# Patient Record
Sex: Male | Born: 1965 | Race: White | Hispanic: No | Marital: Married | Smoking: Current every day smoker
Health system: Southern US, Community
[De-identification: ages and names within clinical notes are randomized; demographics above are authoritative.]

## PROBLEM LIST (undated history)

## (undated) DIAGNOSIS — K219 Gastro-esophageal reflux disease without esophagitis: Secondary | ICD-10-CM

## (undated) DIAGNOSIS — F17200 Nicotine dependence, unspecified, uncomplicated: Secondary | ICD-10-CM

## (undated) DIAGNOSIS — E785 Hyperlipidemia, unspecified: Secondary | ICD-10-CM

## (undated) DIAGNOSIS — I10 Essential (primary) hypertension: Secondary | ICD-10-CM

---

## 1997-08-03 ENCOUNTER — Emergency Department (HOSPITAL_COMMUNITY): Admission: EM | Admit: 1997-08-03 | Discharge: 1997-08-03 | Payer: Self-pay

## 2004-04-06 ENCOUNTER — Ambulatory Visit: Payer: Self-pay | Admitting: Internal Medicine

## 2004-06-21 ENCOUNTER — Ambulatory Visit: Payer: Self-pay | Admitting: Internal Medicine

## 2004-07-12 ENCOUNTER — Ambulatory Visit: Payer: Self-pay | Admitting: Internal Medicine

## 2005-04-07 ENCOUNTER — Ambulatory Visit: Payer: Self-pay | Admitting: Internal Medicine

## 2005-09-20 ENCOUNTER — Ambulatory Visit: Payer: Self-pay | Admitting: Internal Medicine

## 2005-10-10 ENCOUNTER — Ambulatory Visit: Payer: Self-pay | Admitting: Internal Medicine

## 2010-02-02 ENCOUNTER — Emergency Department (HOSPITAL_COMMUNITY)
Admission: EM | Admit: 2010-02-02 | Discharge: 2010-02-02 | Payer: Self-pay | Source: Home / Self Care | Admitting: Emergency Medicine

## 2010-02-07 LAB — URINE CULTURE
Colony Count: NO GROWTH
Culture  Setup Time: 201201112123
Culture: NO GROWTH

## 2010-02-07 LAB — DIFFERENTIAL
Basophils Absolute: 0.1 10*3/uL (ref 0.0–0.1)
Basophils Relative: 0 % (ref 0–1)
Eosinophils Absolute: 0.2 10*3/uL (ref 0.0–0.7)
Eosinophils Relative: 2 % (ref 0–5)
Lymphocytes Relative: 23 % (ref 12–46)
Lymphs Abs: 2.9 10*3/uL (ref 0.7–4.0)
Monocytes Absolute: 0.5 10*3/uL (ref 0.1–1.0)
Monocytes Relative: 4 % (ref 3–12)
Neutro Abs: 9 10*3/uL — ABNORMAL HIGH (ref 1.7–7.7)
Neutrophils Relative %: 71 % (ref 43–77)

## 2010-02-07 LAB — COMPREHENSIVE METABOLIC PANEL
ALT: 21 U/L (ref 0–53)
AST: 20 U/L (ref 0–37)
Albumin: 4.3 g/dL (ref 3.5–5.2)
Alkaline Phosphatase: 86 U/L (ref 39–117)
BUN: 8 mg/dL (ref 6–23)
CO2: 23 mEq/L (ref 19–32)
Calcium: 9.1 mg/dL (ref 8.4–10.5)
Chloride: 103 mEq/L (ref 96–112)
Creatinine, Ser: 0.96 mg/dL (ref 0.4–1.5)
GFR calc Af Amer: >60 mL/min (ref >60)
GFR calc non Af Amer: >60 mL/min (ref >60)
Glucose, Bld: 114 mg/dL — ABNORMAL HIGH (ref 70–99)
Potassium: 4 mEq/L (ref 3.5–5.1)
Sodium: 134 mEq/L — ABNORMAL LOW (ref 135–145)
Total Bilirubin: 0.5 mg/dL (ref 0.3–1.2)
Total Protein: 7.2 g/dL (ref 6.0–8.3)

## 2010-02-07 LAB — URINALYSIS, ROUTINE W REFLEX MICROSCOPIC
Bilirubin Urine: NEGATIVE
Hgb urine dipstick: NEGATIVE
Ketones, ur: NEGATIVE mg/dL
Nitrite: NEGATIVE
Protein, ur: NEGATIVE mg/dL
Specific Gravity, Urine: 1.013 (ref 1.005–1.030)
Urine Glucose, Fasting: NEGATIVE mg/dL
Urobilinogen, UA: 1 mg/dL (ref 0.0–1.0)
pH: 7 (ref 5.0–8.0)

## 2010-02-07 LAB — CBC
HCT: 47.5 % (ref 39.0–52.0)
Hemoglobin: 16.8 g/dL (ref 13.0–17.0)
MCH: 30.1 pg (ref 26.0–34.0)
MCHC: 35.4 g/dL (ref 30.0–36.0)
MCV: 85.1 fL (ref 78.0–100.0)
Platelets: 314 10*3/uL (ref 150–400)
RBC: 5.58 MIL/uL (ref 4.22–5.81)
RDW: 12.8 % (ref 11.5–15.5)
WBC: 12.6 10*3/uL — ABNORMAL HIGH (ref 4.0–10.5)

## 2010-02-07 LAB — LACTIC ACID, PLASMA: Lactic Acid, Venous: 1.6 mmol/L (ref 0.5–2.2)

## 2010-02-07 LAB — LIPASE, BLOOD: Lipase: 51 U/L (ref 11–59)

## 2010-06-10 NOTE — Assessment & Plan Note (Signed)
[facility]                               PULMONARY OFFICE NOTE   NAME:SUTPHINKayan, Gerald George                     MRN:          [id number]  DATE:10/10/2005                            DOB:          03-25-1965    Pulmonary allergy followup.   PROBLEM:  1. Asthma.  2. Allergic rhinitis.  3. Esophageal reflux.  4. Tobacco use.   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  He has continued smoking 1 pack per day.  He comes now to establish with this practice for continuity.  He has been  getting his allergy shots at 1:50, given at [facility] in [place],  with no problems at all.  He is taking Prevacid on a regular basis every 3  to 4 days, and says this controls his reflux symptoms.  He feels he is doing  well.  Just in the past week he has had a mild congestion of the nose and  chest with clear secretion.  He thinks it might have been a cold, but it is  already improving.  He has no morning cough, discolored sputum or active  changes.  His last chest x-ray was some time in early 2005.   MEDICATIONS:  Allergy vaccine, Prevacid.   ALLERGIES:  No medication allergy.   OBJECTIVE:  VITAL SIGNS:  Weight 198 pounds, BP 124/68, pulse regular 90,  room air saturation 97%.  GENERAL:  This is a comfortable-appearing man with a mild cough after deep  breath only. A little watery sniffing.  LUNGS:  Otherwise lung fields sound clear.  NECK:  No adenopathy.  HEART:  Heart sounds are regular without murmur or gallop.  EXTREMITIES:  There is no cyanosis, clubbing or edema.   IMPRESSION:  1. Asthma with bronchitis, probably multifactorial allergic, tobacco and      occupational welding fume exposures.  2. Allergic rhinitis.  3. Esophageal reflux, controlled with intermittent Prevacid.   PLAN:  1. Heavy emphasis  today on smoking cessation.  He agreed to a booklet and      prescription for Chantix.  2. Chest x-ray.  3. Suggested Claritin, but gave as an alternative a prescription for      generic Allegra 180 mg one daily p.r.n. for nasal drainage.  4. Continue allergy vaccine at 1:50.  5. Schedule return here 6 months, earlier p.r.n.                                   Clinton D. Maple Hudson, MD, [facility], FACP   CDY/MedQ  DD:  10/10/2005  DT:  10/12/2005  Job #:  [id number]   cc:   [facility]

## 2014-05-15 ENCOUNTER — Emergency Department
Admit: 2014-05-15 | Disposition: A | Discharge status: Home / Self Care | Payer: Self-pay | Admitting: Emergency Medicine

## 2014-05-16 LAB — COMPREHENSIVE METABOLIC PANEL
Albumin: 4 g/dL
Alkaline Phosphatase: 79 U/L
Anion Gap: 11 (ref 7–16)
BUN: 14 mg/dL
Bilirubin,Total: 0.5 mg/dL
Calcium, Total: 9.3 mg/dL
Chloride: 101 mmol/L
Co2: 25 mmol/L
Creatinine: 0.91 mg/dL
EGFR (African American): >60
EGFR (Non-African Amer.): >60
Glucose: 135 mg/dL — ABNORMAL HIGH
Potassium: 3.8 mmol/L
SGOT(AST): 19 U/L
SGPT (ALT): 28 U/L
Sodium: 137 mmol/L
Total Protein: 7.3 g/dL

## 2014-05-16 LAB — CBC WITH DIFFERENTIAL/PLATELET
Basophil #: 0 10*3/uL (ref 0.0–0.1)
Basophil %: 0.2 %
Eosinophil #: 0 10*3/uL (ref 0.0–0.7)
Eosinophil %: 0.1 %
HCT: 45.1 % (ref 40.0–52.0)
HGB: 15.1 g/dL (ref 13.0–18.0)
Lymphocyte #: 3.5 10*3/uL (ref 1.0–3.6)
Lymphocyte %: 18.6 %
MCH: 28.8 pg (ref 26.0–34.0)
MCHC: 33.5 g/dL (ref 32.0–36.0)
MCV: 86 fL (ref 80–100)
Monocyte #: 1 x10 3/mm (ref 0.2–1.0)
Monocyte %: 5.5 %
Neutrophil #: 14.2 10*3/uL — ABNORMAL HIGH (ref 1.4–6.5)
Neutrophil %: 75.6 %
Platelet: 339 10*3/uL (ref 150–440)
RBC: 5.26 10*6/uL (ref 4.40–5.90)
RDW: 13.3 % (ref 11.5–14.5)
WBC: 18.8 10*3/uL — ABNORMAL HIGH (ref 3.8–10.6)

## 2015-09-14 ENCOUNTER — Emergency Department
Admission: EM | Admit: 2015-09-14 | Discharge: 2015-09-14 | Disposition: A | Discharge status: Home / Self Care | Payer: Managed Care, Other (non HMO) | Attending: Emergency Medicine | Admitting: Emergency Medicine

## 2015-09-14 ENCOUNTER — Emergency Department: Payer: Managed Care, Other (non HMO)

## 2015-09-14 ENCOUNTER — Encounter: Payer: Self-pay | Admitting: Medical Oncology

## 2015-09-14 DIAGNOSIS — R1013 Epigastric pain: Secondary | ICD-10-CM | POA: Diagnosis not present

## 2015-09-14 DIAGNOSIS — R0600 Dyspnea, unspecified: Secondary | ICD-10-CM | POA: Insufficient documentation

## 2015-09-14 HISTORY — DX: Gastro-esophageal reflux disease without esophagitis: K21.9

## 2015-09-14 LAB — CBC WITH DIFFERENTIAL/PLATELET
Basophils Absolute: 0.1 10*3/uL (ref 0–0.1)
Basophils Relative: 1 %
Eosinophils Absolute: 0.3 10*3/uL (ref 0–0.7)
Eosinophils Relative: 2 %
HCT: 45.5 % (ref 40.0–52.0)
Hemoglobin: 16 g/dL (ref 13.0–18.0)
Lymphocytes Relative: 23 %
Lymphs Abs: 3.2 10*3/uL (ref 1.0–3.6)
MCH: 29.8 pg (ref 26.0–34.0)
MCHC: 35.2 g/dL (ref 32.0–36.0)
MCV: 84.7 fL (ref 80.0–100.0)
Monocytes Absolute: 0.8 10*3/uL (ref 0.2–1.0)
Monocytes Relative: 6 %
Neutro Abs: 9.2 10*3/uL — ABNORMAL HIGH (ref 1.4–6.5)
Neutrophils Relative %: 68 %
Platelets: 258 10*3/uL (ref 150–440)
RBC: 5.37 MIL/uL (ref 4.40–5.90)
RDW: 13.6 % (ref 11.5–14.5)
WBC: 13.6 10*3/uL — ABNORMAL HIGH (ref 3.8–10.6)

## 2015-09-14 LAB — COMPREHENSIVE METABOLIC PANEL
ALT: 31 U/L (ref 17–63)
AST: 22 U/L (ref 15–41)
Albumin: 4 g/dL (ref 3.5–5.0)
Alkaline Phosphatase: 66 U/L (ref 38–126)
Anion gap: 5 (ref 5–15)
BUN: 10 mg/dL (ref 6–20)
CO2: 23 mmol/L (ref 22–32)
Calcium: 8.9 mg/dL (ref 8.9–10.3)
Chloride: 109 mmol/L (ref 101–111)
Creatinine, Ser: 0.76 mg/dL (ref 0.61–1.24)
GFR calc Af Amer: >60 mL/min (ref >60)
GFR calc non Af Amer: >60 mL/min (ref >60)
Glucose, Bld: 86 mg/dL (ref 65–99)
Potassium: 4 mmol/L (ref 3.5–5.1)
Sodium: 137 mmol/L (ref 135–145)
Total Bilirubin: 0.5 mg/dL (ref 0.3–1.2)
Total Protein: 7 g/dL (ref 6.5–8.1)

## 2015-09-14 LAB — TROPONIN I: Troponin I: <0.03 ng/mL (ref <0.03)

## 2015-09-14 LAB — LIPASE, BLOOD: Lipase: 58 U/L — ABNORMAL HIGH (ref 11–51)

## 2015-09-14 MED ORDER — ALBUTEROL SULFATE (2.5 MG/3ML) 0.083% IN NEBU
5.0000 mg | INHALATION_SOLUTION | Freq: Once | RESPIRATORY_TRACT | Status: AC
  Administered 2015-09-14: 5 mg via RESPIRATORY_TRACT
  Filled 2015-09-14: qty 6

## 2015-09-14 MED ORDER — FAMOTIDINE 20 MG PO TABS: 20.0000 mg | ORAL_TABLET | Freq: Two times a day (BID) | ORAL | 0 refills | Status: DC

## 2015-09-14 MED ORDER — FAMOTIDINE 20 MG PO TABS
40.0000 mg | ORAL_TABLET | Freq: Once | ORAL | Status: AC
  Administered 2015-09-14: 40 mg via ORAL
  Filled 2015-09-14: qty 2

## 2015-09-14 MED ORDER — IPRATROPIUM-ALBUTEROL 0.5-2.5 (3) MG/3ML IN SOLN
3.0000 mL | Freq: Once | RESPIRATORY_TRACT | Status: AC
  Administered 2015-09-14: 3 mL via RESPIRATORY_TRACT

## 2015-09-14 MED ORDER — IPRATROPIUM-ALBUTEROL 0.5-2.5 (3) MG/3ML IN SOLN
RESPIRATORY_TRACT | Status: AC
  Administered 2015-09-14: 3 mL via RESPIRATORY_TRACT
  Filled 2015-09-14: qty 3

## 2015-09-14 MED ORDER — GI COCKTAIL ~~LOC~~
30.0000 mL | ORAL | Status: AC
  Administered 2015-09-14: 30 mL via ORAL
  Filled 2015-09-14: qty 30

## 2015-09-14 MED ORDER — METHYLPREDNISOLONE SODIUM SUCC 125 MG IJ SOLR
125.0000 mg | Freq: Once | INTRAMUSCULAR | Status: AC
  Administered 2015-09-14: 125 mg via INTRAVENOUS
  Filled 2015-09-14: qty 2

## 2015-09-14 MED ORDER — SUCRALFATE 1 G PO TABS: 1.0000 g | ORAL_TABLET | Freq: Four times a day (QID) | ORAL | 1 refills | Status: DC

## 2015-09-14 NOTE — ED Notes (Signed)
Patient accidentally dislodged IV catheter. Pressure dressing to site. States cannot tell if GI cocktail changed pain.

## 2015-09-14 NOTE — ED Triage Notes (Signed)
Pt reports he began having upper abd pain/epigastric pain last night, pain worsens when he lays down. Pain radiates into his back, when it hits makes him feel sob.

## 2015-09-14 NOTE — ED Provider Notes (Signed)
[facility] Emergency Department Provider Note  ____________________________________________  Time seen: Approximately 8:54 AM  I have reviewed the triage vital signs and the nursing notes.   HISTORY  Chief Complaint Abdominal Pain    HPI Gerald George is a 50 y.o. male who complains of epigastric pain radiating to his back, feels like sore aching feeling. Worse with movement and lying supine. Associated with feeling of shortness of breath due to pain worsening when he breathes. No chest pain. No cough. No exertional symptoms. No fevers chills or sweats. No vomiting or diarrhea. Pain is moderate intensity, started this morning around 3 AM. Not affected by eating.     Past Medical History:  Diagnosis Date  . GERD (gastroesophageal reflux disease)      There are no active problems to display for this patient.    History reviewed. No pertinent surgical history.   Prior to Admission medications   Medication Sig Start Date End Date Taking? Authorizing Provider  famotidine (PEPCID) 20 MG tablet Take 1 tablet (20 mg total) by mouth 2 (two) times daily. 09/14/15   Sharman CheekPhillip Kenni Newton, MD  sucralfate (CARAFATE) 1 g tablet Take 1 tablet (1 g total) by mouth 4 (four) times daily. 09/14/15   Sharman CheekPhillip Jihan Mellette, MD     Allergies Review of patient's allergies indicates no known allergies.   No family history on file.  Social History Social History  Substance Use Topics  . Smoking status: Not on file  . Smokeless tobacco: Not on file  . Alcohol use Not on file  One pack per day smoker for the past 40 years. No alcohol or drug use  Review of Systems  Constitutional:   No fever or chills.  ENT:   No sore throat. No rhinorrhea. Cardiovascular:   No chest pain. Respiratory:   No dyspnea or cough. Gastrointestinal:   Positive epigastric pain without vomiting or diarrhea.  Genitourinary:   Negative for dysuria or difficulty urinating. Musculoskeletal:    Negative for focal pain or swelling Neurological:   Negative for headaches 10-point ROS otherwise negative.  ____________________________________________   PHYSICAL EXAM:  VITAL SIGNS: ED Triage Vitals  Enc Vitals Group     BP 09/14/15 0833 (!) 172/106     Pulse Rate 09/14/15 0833 93     Resp 09/14/15 0833 18     Temp 09/14/15 0833 98.1 F (36.7 C)     Temp Source 09/14/15 0833 Oral     SpO2 09/14/15 0833 97 %     Weight 09/14/15 0834 210 lb (95.3 kg)     Height 09/14/15 0834 5\' 7"  (1.702 m)     Head Circumference --      Peak Flow --      Pain Score 09/14/15 0834 6     Pain Loc --      Pain Edu? --      Excl. in GC? --     Vital signs reviewed, nursing assessments reviewed.   Constitutional:   Alert and oriented. Well appearing and in no distress. Eyes:   No scleral icterus. No conjunctival pallor. PERRL. EOMI.  No nystagmus. ENT   Head:   Normocephalic and atraumatic.   Nose:   No congestion/rhinnorhea. No septal hematoma   Mouth/Throat:   MMM, no pharyngeal erythema. No peritonsillar mass.    Neck:   No stridor. No SubQ emphysema. No meningismus. Hematological/Lymphatic/Immunilogical:   No cervical lymphadenopathy. Cardiovascular:   RRR. Symmetric bilateral radial and DP pulses.  No murmurs.  Respiratory:   Normal respiratory effort without tachypnea nor retractions. Coarse breath sounds diffusely with expiratory wheezing with forceful expiration. No focal findings.. Chest wall nontender Gastrointestinal:   Soft and nontender. Non distended. There is no CVA tenderness.  No rebound, rigidity, or guarding. Genitourinary:   deferred Musculoskeletal:   Nontender with normal range of motion in all extremities. No joint effusions.  No lower extremity tenderness.  No edema. Neurologic:   Normal speech and language.  CN 2-10 normal. Motor grossly intact. No gross focal neurologic deficits are appreciated.  Skin:    Skin is warm, dry and intact. No rash noted.   No petechiae, purpura, or bullae.  ____________________________________________    LABS (pertinent positives/negatives) (all labs ordered are listed, but only abnormal results are displayed) Labs Reviewed  LIPASE, BLOOD - Abnormal; Notable for the following:       Result Value   Lipase 58 (*)    All other components within normal limits  CBC WITH DIFFERENTIAL/PLATELET - Abnormal; Notable for the following:    WBC 13.6 (*)    Neutro Abs 9.2 (*)    All other components within normal limits  COMPREHENSIVE METABOLIC PANEL  TROPONIN I   ____________________________________________   EKG  Interpreted by me Normal sinus rhythm rate of 89, normal axis intervals. Poor R-wave progression in anterior precordial leads. Normal ST segments and T waves.  ____________________________________________    RADIOLOGY  Chest x-ray unremarkable  ____________________________________________   PROCEDURES Procedures  ____________________________________________   INITIAL IMPRESSION / ASSESSMENT AND PLAN / ED COURSE  Pertinent labs & imaging results that were available during my care of the patient were reviewed by me and considered in my medical decision making (see chart for details).  Patient presents with epigastric pain and subjective shortness of breath. Vital signs unremarkable. Labs and chest x-ray unremarkable.Considering the patient's symptoms, medical history, and physical examination today, I have low suspicion for ACS, PE, TAD, pneumothorax, carditis, mediastinitis, pneumonia, CHF, or sepsis.  I have low suspicion for cholecystitis or biliary pathology, pancreatitis, perforation or bowel obstruction, hernia, intra-abdominal abscess, AAA or dissection, volvulus or intussusception, mesenteric ischemia, or appendicitis.  Most likely this is COPD exacerbation related to long-term smoking, versus gastritis and acid reflux.     Clinical Course  Comment By Time  Workup  unremarkable. Sharman CheekPhillip Inmer Nix, MD 08/22 [phone]  Reports symptoms have not changed. Workup is negative. Wheezing is improved but still present on repeat exam. Abdomen is still nontender. will give steroids and further bronchodilators and reassess again. Sharman CheekPhillip Hiroko Tregre, MD 08/22 1005    ----------------------------------------- 1:00 PM on 09/14/2015 -----------------------------------------  Patient ambulatory without distress. Exam continues to improve after bronchodilators and steroids. Well appearing. Has no symptoms at rest, just continues to report ongoing upper abdominal pain with changes in position primarily. Again the abdomen is completely benign and nontender.  Presentation is not consistent with aortic injury or ACS. Treatment with antacids, follow up with primary care. Usual return cautions given should symptoms worsen. ____________________________________________   FINAL CLINICAL IMPRESSION(S) / ED DIAGNOSES  Final diagnoses:  Epigastric pain  Dyspnea       Portions of this note were generated with dragon dictation software. Dictation errors may occur despite best attempts at proofreading.    Sharman CheekPhillip Cailey Trigueros, MD 09/14/15 [phone]

## 2015-10-01 ENCOUNTER — Other Ambulatory Visit: Payer: Self-pay | Admitting: Internal Medicine

## 2015-10-01 DIAGNOSIS — R0781 Pleurodynia: Secondary | ICD-10-CM

## 2015-10-06 ENCOUNTER — Ambulatory Visit
Admission: RE | Admit: 2015-10-06 | Discharge: 2015-10-06 | Disposition: A | Discharge status: Home / Self Care | Payer: Managed Care, Other (non HMO) | Source: Ambulatory Visit | Attending: Internal Medicine | Admitting: Internal Medicine

## 2015-10-06 DIAGNOSIS — K760 Fatty (change of) liver, not elsewhere classified: Secondary | ICD-10-CM | POA: Insufficient documentation

## 2015-10-06 DIAGNOSIS — R0781 Pleurodynia: Secondary | ICD-10-CM

## 2015-10-06 MED ORDER — IOPAMIDOL (ISOVUE-370) INJECTION 76%
100.0000 mL | Freq: Once | INTRAVENOUS | Status: AC | PRN
  Administered 2015-10-06: 100 mL via INTRAVENOUS

## 2017-02-22 ENCOUNTER — Emergency Department
Admission: EM | Admit: 2017-02-22 | Discharge: 2017-02-22 | Disposition: A | Discharge status: Home / Self Care | Payer: BLUE CROSS/BLUE SHIELD | Attending: Emergency Medicine | Admitting: Emergency Medicine

## 2017-02-22 ENCOUNTER — Emergency Department: Payer: BLUE CROSS/BLUE SHIELD

## 2017-02-22 ENCOUNTER — Other Ambulatory Visit: Payer: Self-pay

## 2017-02-22 DIAGNOSIS — J441 Chronic obstructive pulmonary disease with (acute) exacerbation: Secondary | ICD-10-CM | POA: Insufficient documentation

## 2017-02-22 DIAGNOSIS — I10 Essential (primary) hypertension: Secondary | ICD-10-CM | POA: Diagnosis not present

## 2017-02-22 DIAGNOSIS — J069 Acute upper respiratory infection, unspecified: Secondary | ICD-10-CM | POA: Insufficient documentation

## 2017-02-22 DIAGNOSIS — F17210 Nicotine dependence, cigarettes, uncomplicated: Secondary | ICD-10-CM | POA: Insufficient documentation

## 2017-02-22 DIAGNOSIS — R05 Cough: Secondary | ICD-10-CM | POA: Diagnosis present

## 2017-02-22 HISTORY — DX: Essential (primary) hypertension: I10

## 2017-02-22 LAB — CBC
HCT: 47.3 % (ref 40.0–52.0)
HEMOGLOBIN: 15.8 g/dL (ref 13.0–18.0)
MCH: 29 pg (ref 26.0–34.0)
MCHC: 33.5 g/dL (ref 32.0–36.0)
MCV: 86.7 fL (ref 80.0–100.0)
PLATELETS: 207 10*3/uL (ref 150–440)
RBC: 5.45 MIL/uL (ref 4.40–5.90)
RDW: 13.8 % (ref 11.5–14.5)
WBC: 7.6 10*3/uL (ref 3.8–10.6)

## 2017-02-22 LAB — BASIC METABOLIC PANEL
ANION GAP: 9 (ref 5–15)
BUN: 12 mg/dL (ref 6–20)
CHLORIDE: 105 mmol/L (ref 101–111)
CO2: 21 mmol/L — ABNORMAL LOW (ref 22–32)
CREATININE: 0.89 mg/dL (ref 0.61–1.24)
Calcium: 8.4 mg/dL — ABNORMAL LOW (ref 8.9–10.3)
GFR calc non Af Amer: >60 mL/min (ref >60)
Glucose, Bld: 160 mg/dL — ABNORMAL HIGH (ref 65–99)
POTASSIUM: 3.5 mmol/L (ref 3.5–5.1)
SODIUM: 135 mmol/L (ref 135–145)

## 2017-02-22 LAB — TROPONIN I: Troponin I: <0.03 ng/mL (ref <0.03)

## 2017-02-22 LAB — INFLUENZA PANEL BY PCR (TYPE A & B)
INFLBPCR: NEGATIVE
Influenza A By PCR: POSITIVE — AB

## 2017-02-22 MED ORDER — ALBUTEROL SULFATE HFA 108 (90 BASE) MCG/ACT IN AERS
2.0000 | INHALATION_SPRAY | Freq: Four times a day (QID) | RESPIRATORY_TRACT | 0 refills | Status: DC | PRN
Start: 2017-02-22 — End: 2023-08-07

## 2017-02-22 MED ORDER — IPRATROPIUM-ALBUTEROL 0.5-2.5 (3) MG/3ML IN SOLN
3.0000 mL | Freq: Once | RESPIRATORY_TRACT | Status: AC
  Administered 2017-02-22: 3 mL via RESPIRATORY_TRACT
  Filled 2017-02-22: qty 3

## 2017-02-22 MED ORDER — PREDNISONE 20 MG PO TABS
60.0000 mg | ORAL_TABLET | Freq: Once | ORAL | Status: AC
  Administered 2017-02-22: 60 mg via ORAL
  Filled 2017-02-22: qty 3

## 2017-02-22 MED ORDER — AZITHROMYCIN 250 MG PO TABS: 250.0000 mg | ORAL_TABLET | Freq: Every day | ORAL | 0 refills | Status: AC

## 2017-02-22 MED ORDER — PREDNISONE 20 MG PO TABS: 60.0000 mg | ORAL_TABLET | Freq: Every day | ORAL | 0 refills | Status: DC

## 2017-02-22 MED ORDER — BENZONATATE 100 MG PO CAPS: 100.0000 mg | ORAL_CAPSULE | Freq: Four times a day (QID) | ORAL | 0 refills | Status: DC | PRN

## 2017-02-22 MED ORDER — AZITHROMYCIN 500 MG PO TABS
500.0000 mg | ORAL_TABLET | Freq: Once | ORAL | Status: AC
  Administered 2017-02-22: 500 mg via ORAL
  Filled 2017-02-22: qty 1

## 2017-02-22 NOTE — ED Provider Notes (Signed)
[facility] Emergency Department Provider Note  ____________________________________________  Time seen: Approximately 10:15 PM  I have reviewed the triage vital signs and the nursing notes.   HISTORY  Chief Complaint Chest Pain and Shortness of Breath    HPI Gerald George is a 52 y.o. male with HTN and ongoing tobacco abuse presenting with cough, chest tightness and shortness of breath.  The patient reports that 2 weeks ago he was treated for bronchitis.  He completed his prednisone taper over 10 days, but took his Augmentin only once daily despite the fact that his primary care physician who prescribed him for twice daily dosing.  Over the past few days, he has noticed increase nonproductive cough and shortness of breath that is worse with exertion.  He has had some mild nausea but no vomiting.  Yesterday he had some loose stool.  No fevers or chills, palpitations, lightheadedness or syncope.  No lower extremity swelling or calf pain.  The patient does not use albuterol at home.  The patient is requesting influenza testing because he watches his young granddaughter.   Past Medical History:  Diagnosis Date  . GERD (gastroesophageal reflux disease)   . Hypertension     There are no active problems to display for this patient.   History reviewed. No pertinent surgical history.  Current Outpatient Rx  . Order #: [id number] Class: Print  . Order #: [id number] Class: Print  . Order #: [id number] Class: Print  . Order #: [id number] Class: Print  . Order #: [id number] Class: Print  . Order #: [id number] Class: Print    Allergies Patient has no known allergies.  No family history on file.  Social History Social History   Tobacco Use  . Smoking status: Current Every Day Smoker    Packs/day: 2.00    Types: Cigarettes  . Smokeless tobacco: Never Used  Substance Use Topics  . Alcohol use: No    Frequency: Never  . Drug use: No    Review of  Systems Constitutional: No fever/chills.  No lightheadedness or syncope. Eyes: No visual changes. ENT: No sore throat.  Positive congestion.  No ear pain. Cardiovascular: Positive chest pain. Denies palpitations. Respiratory: Positive exertional shortness of breath.  No cough. Gastrointestinal: No abdominal pain.  No nausea, no vomiting.  No diarrhea.  No constipation. Genitourinary: Negative for dysuria. Musculoskeletal: Positive for back pain, worse with coughing.  No lower extremity swelling or calf pain. Skin: Negative for rash. Neurological: Negative for headaches. No focal numbness, tingling or weakness.     ____________________________________________   PHYSICAL EXAM:  VITAL SIGNS: ED Triage Vitals  Enc Vitals Group     BP 02/22/17 2126 (!) 157/120     Pulse Rate 02/22/17 2126 (!) 106     Resp 02/22/17 2126 (!) 22     Temp 02/22/17 2126 99 F (37.2 C)     Temp Source 02/22/17 2126 Oral     SpO2 02/22/17 2126 98 %     Weight 02/22/17 2128 201 lb (91.2 kg)     Height 02/22/17 2128 5\' 7"  (1.702 m)     Head Circumference --      Peak Flow --      Pain Score 02/22/17 2127 6     Pain Loc --      Pain Edu? --      Excl. in GC? --     Constitutional: Alert and oriented.  Chronically ill appearing but in no acute distress. Answers questions appropriately. Eyes: Conjunctivae are normal.  EOMI. No scleral icterus.  No eye discharge. Head: Atraumatic. Nose: Mild congestion without active rhinorrhea.. Mouth/Throat: Mucous membranes are moist.  Neck: No stridor.  Supple.  No JVD.  No meningismus. Cardiovascular: Normal rate, regular rhythm. No murmurs, rubs or gallops.  Respiratory: Mild tachypnea without accessory muscle use or retractions.  The patient has diffuse end expiratory wheezing.  His O2 sats are 96% with ambulation on room air. Gastrointestinal: Overweight.  Soft, nontender and nondistended.  No guarding or rebound.  No peritoneal signs. Musculoskeletal: No LE  edema. No ttp in the calves or palpable cords.  Negative Homan's sign. Neurologic:  A&Ox3.  Speech is clear.  Face and smile are symmetric.  EOMI.  Moves all extremities well. Skin:  Skin is warm, dry and intact. No rash noted. Psychiatric: Mood and affect are normal. Speech and behavior are normal.  Normal judgement  ____________________________________________   LABS (all labs ordered are listed, but only abnormal results are displayed)  Labs Reviewed  BASIC METABOLIC PANEL - Abnormal; Notable for the following components:      Result Value   CO2 21 (*)    Glucose, Bld 160 (*)    Calcium 8.4 (*)    All other components within normal limits  CBC  TROPONIN I  INFLUENZA PANEL BY PCR (TYPE A & B)   ____________________________________________  EKG  ED ECG REPORT I, Rockne Menghini, the attending physician, personally viewed and interpreted this ECG.   Date: 02/22/2017  EKG Time: 2127  Rate: 102  Rhythm: sinus tachycardia  Axis: normal  Intervals:none  ST&T Change: no STEMI  ____________________________________________  RADIOLOGY  Dg Chest 2 View  Result Date: 02/22/2017 CLINICAL DATA:  Chest pain radiating to the back since last night. Two week history of bronchitis treated with antibiotics and prednisone. Shortness of breath and cough. Nausea. EXAM: CHEST  2 VIEW COMPARISON:  09/14/2015 FINDINGS: The heart size and mediastinal contours are within normal limits. Both lungs are clear. The visualized skeletal structures are unremarkable. IMPRESSION: No active cardiopulmonary disease. Electronically Signed   By: Burman Nieves M.D.   On: 02/22/2017 21:57    ____________________________________________   PROCEDURES  Procedure(s) performed: None  Procedures  Critical Care performed: No ____________________________________________   INITIAL IMPRESSION / ASSESSMENT AND PLAN / ED COURSE  Pertinent labs & imaging results that were available during my care of  the patient were reviewed by me and considered in my medical decision making (see chart for details).  52 y.o. male with ongoing tobacco abuse presenting with cough, chest tightness, wheezing, congestion and rhinorrhea, without fever.  The patient's symptoms are most likely due to a viral URI exacerbating his underlying COPD.  He may have a bacterial infection, as he did not complete his antibiotics as prescribed.  His chest x-ray does not show a pneumonia, nor do I hear a focal infiltrate on my auscultatory exam, but we will treat him with azithromycin.  He does have some wheezing, consistent with his COPD, so we will give him a DuoNeb here.  I will initiate him on a 5-day prednisone burst.  We will swab him for influenza.  There is no evidence of pneumothorax, ACS or MI, and PE is very unlikely.  The patient does have some wheezing but has no hypoxia with ambulation.  Anticipate that we will be able to discharge the patient home after his symptomatic treatment, with close PMD follow-up.  I did discuss smoking cessation and decreasing smoking with the patient and his wife  ____________________________________________  FINAL CLINICAL IMPRESSION(S) / ED DIAGNOSES  Final diagnoses:  COPD exacerbation (HCC)  Upper respiratory tract infection, unspecified type         NEW MEDICATIONS STARTED DURING THIS VISIT:  New Prescriptions   ALBUTEROL (PROVENTIL HFA;VENTOLIN HFA) 108 (90 BASE) MCG/ACT INHALER    Inhale 2 puffs into the lungs every 6 (six) hours as needed for wheezing or shortness of breath.   AZITHROMYCIN (ZITHROMAX) 250 MG TABLET    Take 1 tablet (250 mg total) by mouth daily for 4 days.   BENZONATATE (TESSALON PERLES) 100 MG CAPSULE    Take 1 capsule (100 mg total) by mouth every 6 (six) hours as needed for cough.   PREDNISONE (DELTASONE) 20 MG TABLET    Take 3 tablets (60 mg total) by mouth daily.      Rockne Menghini, MD 02/22/17 2224

## 2017-02-22 NOTE — ED Triage Notes (Signed)
Pt c/o chest pain - he states 2 weeks ago he had bronchitis and was tc with atb and prednisone - now his chest and back are hurting - c/o shortness of breath with a non-productive cough - denies vomiting - c/o nausea

## 2017-02-22 NOTE — Discharge Instructions (Signed)
Please drink plenty of fluids to stay well-hydrated.  You may take Tylenol or Motrin if you develop fever.  For your coughing, use your albuterol inhaler, and Tessalon Perles.  Please take the entire course of antibiotics and steroids as prescribed.  Return to the emergency department if you develop severe pain, shortness of breath, lightheadedness or fainting, or any other symptoms concerning to you.

## 2018-02-22 IMAGING — CR DG CHEST 2V
1 series · 2 of 2 positions shown · non-contrast
Comparison: 09/14/2015

CLINICAL DATA: Chest pain radiating to the back since last night.
Two week history of bronchitis treated with antibiotics and
prednisone. Shortness of breath and cough. Nausea.

EXAM:
CHEST  2 VIEW

[Series 1: w chest pa · 0.14mm/px · 2 of 2 slices shown]
[im 1/2]
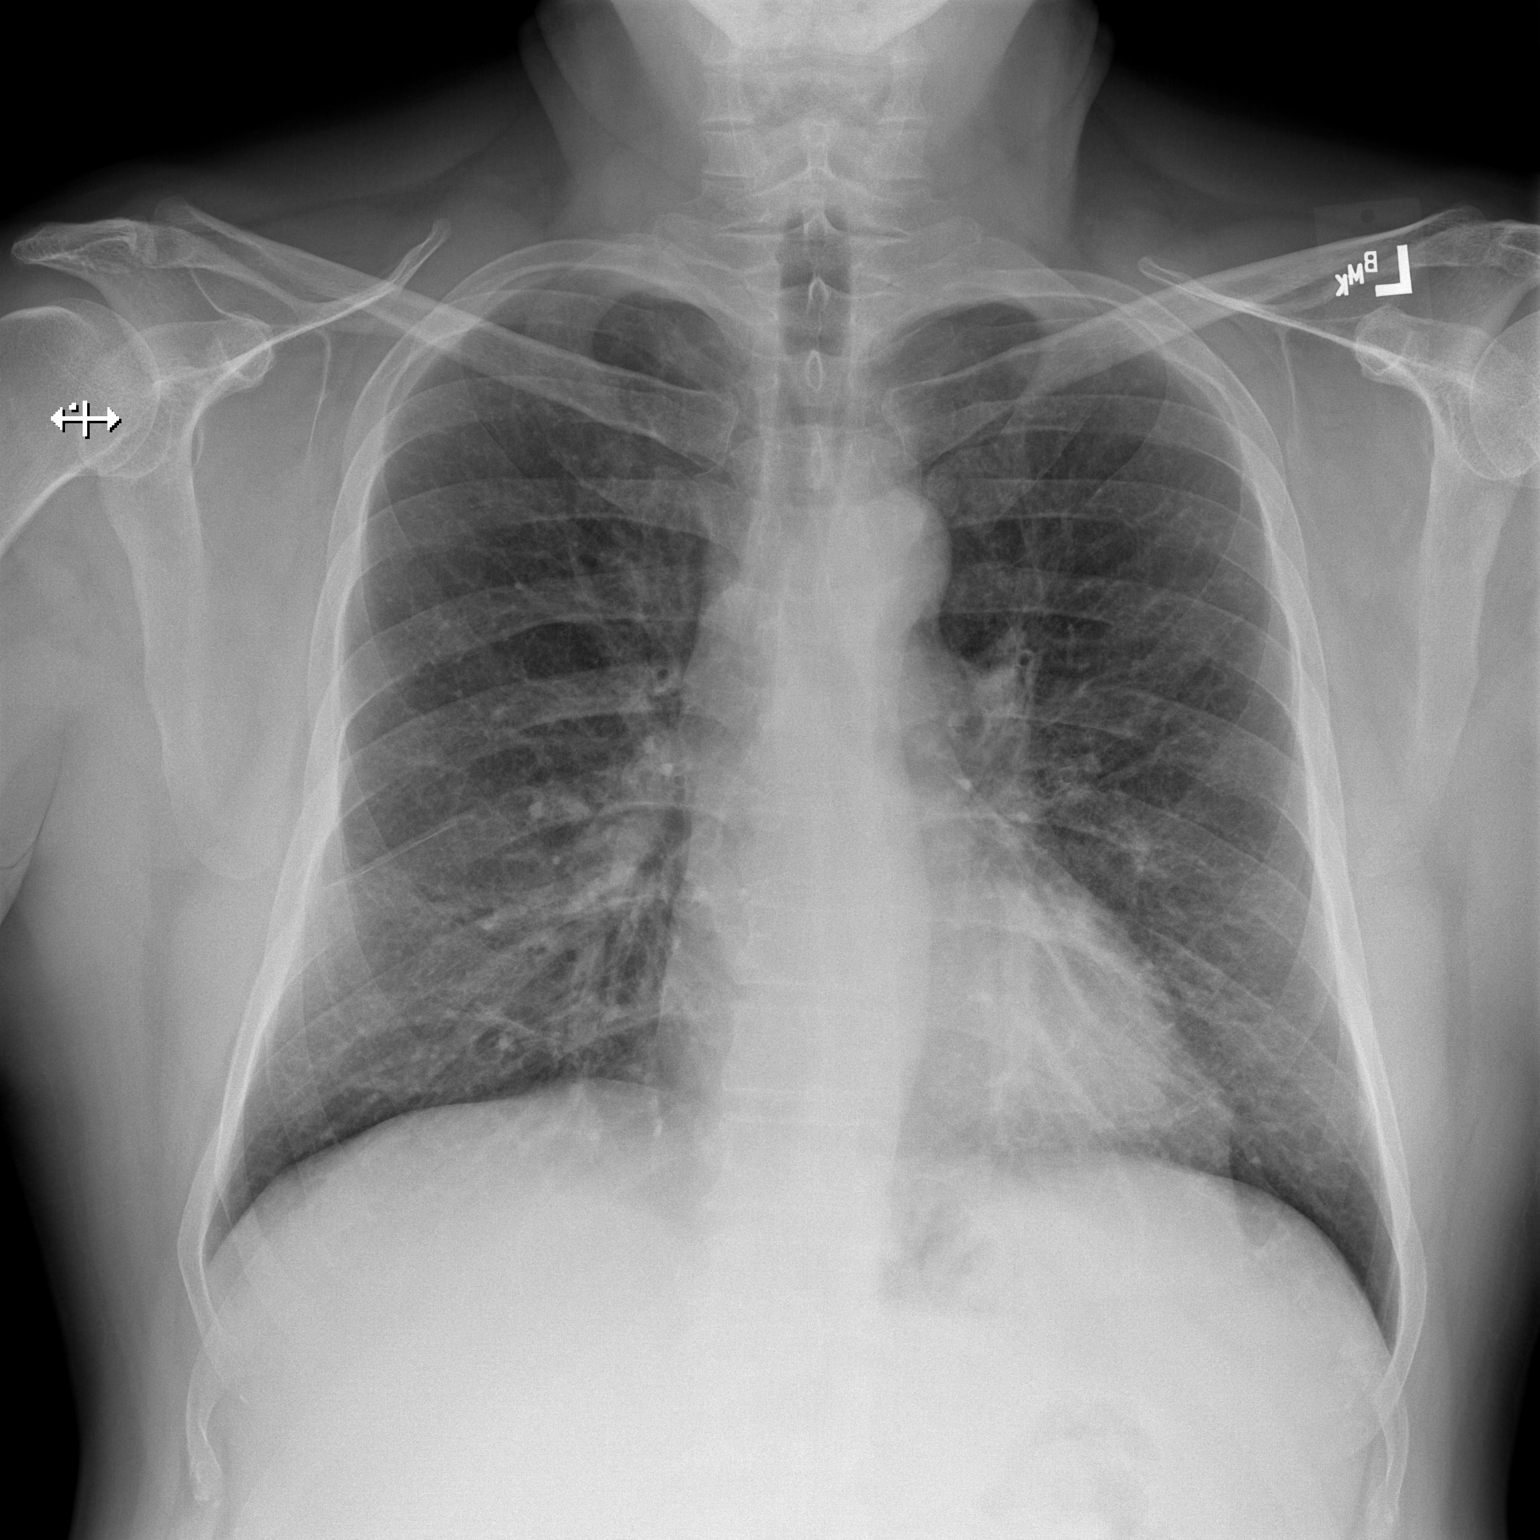
[im 2/2]
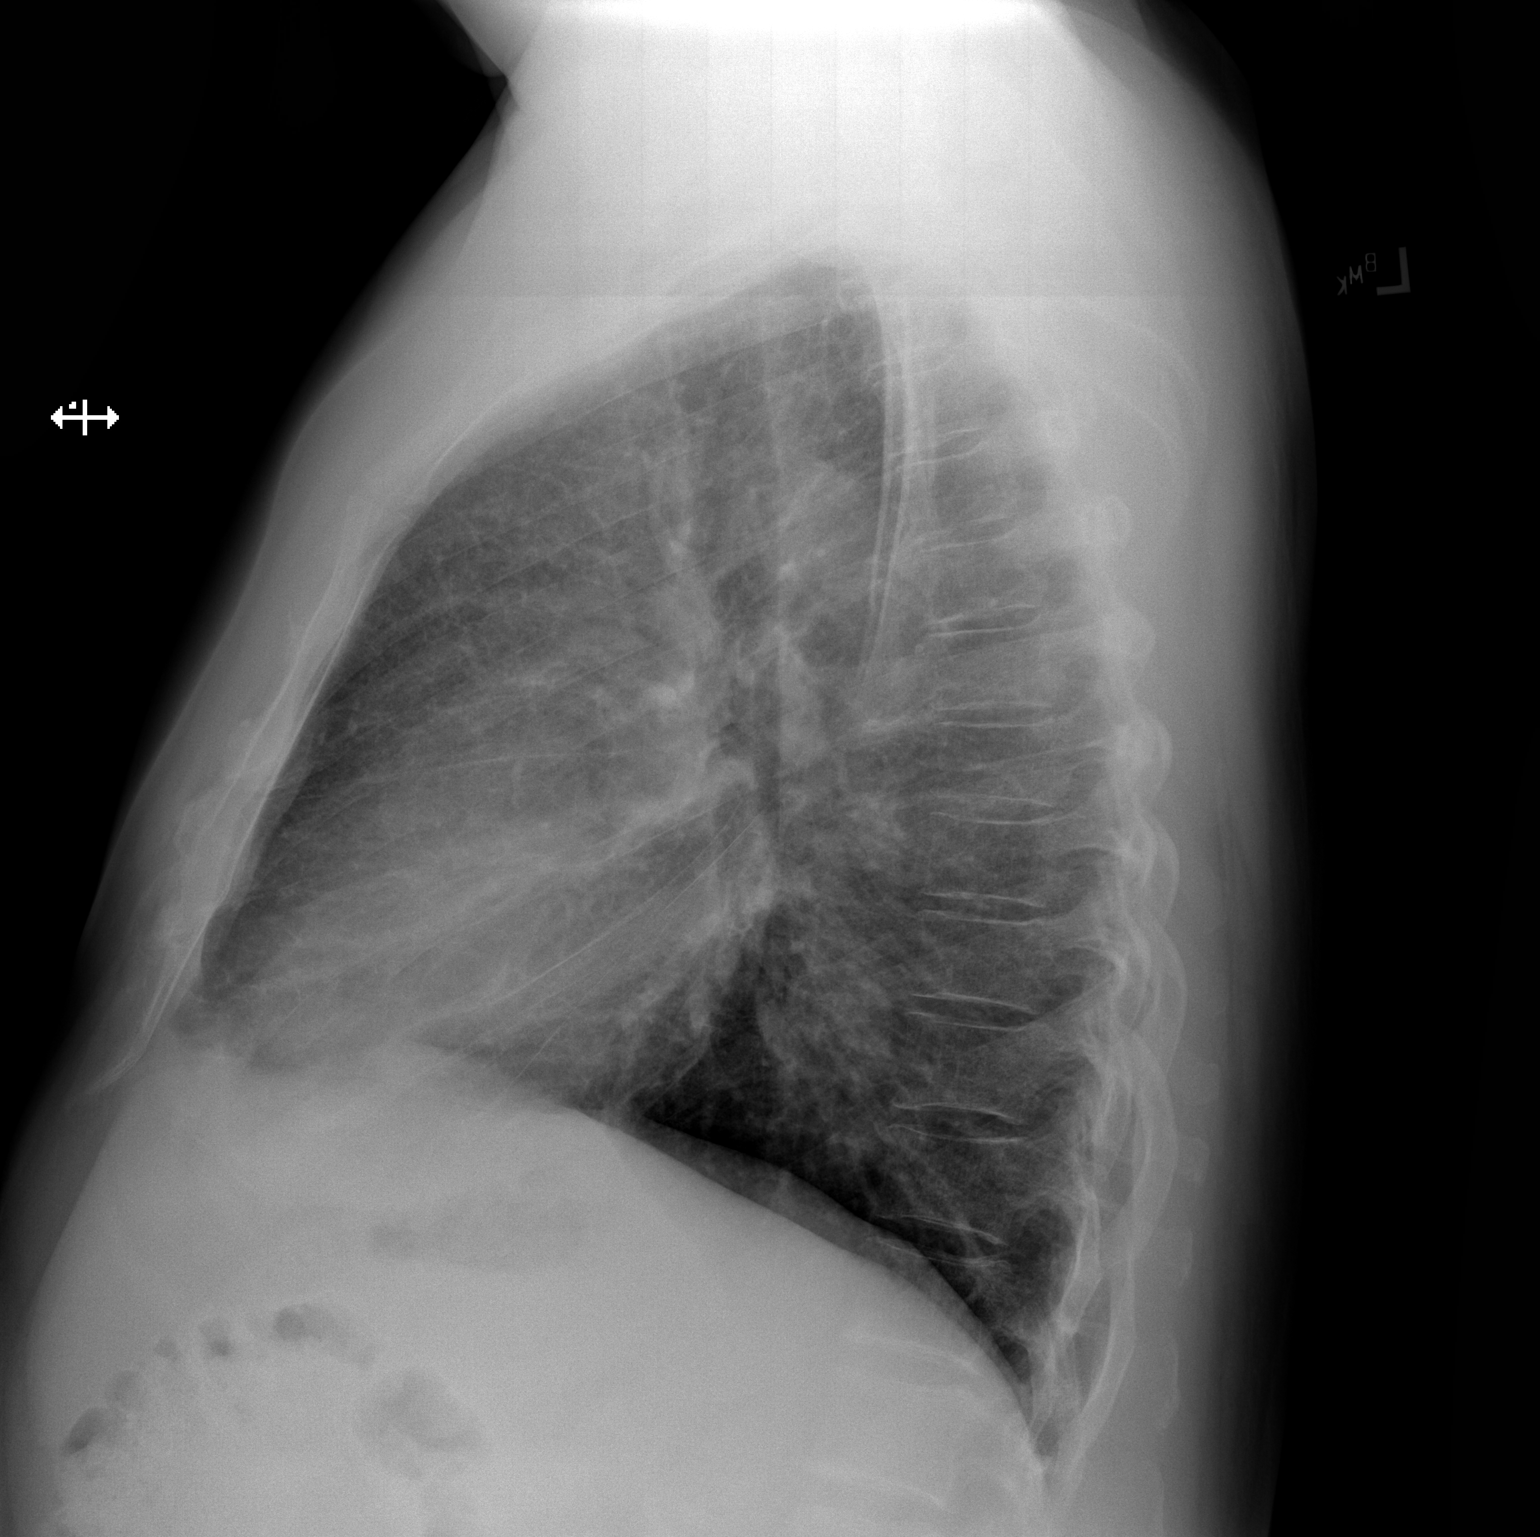

[2 of 2 positions shown; findings below may reference images not displayed]

FINDINGS: The heart size and mediastinal contours are within normal limits.
Both lungs are clear. The visualized skeletal structures are
unremarkable.
IMPRESSION: No active cardiopulmonary disease.

## 2023-01-19 ENCOUNTER — Other Ambulatory Visit: Payer: Self-pay | Admitting: Internal Medicine

## 2023-01-19 DIAGNOSIS — Z122 Encounter for screening for malignant neoplasm of respiratory organs: Secondary | ICD-10-CM

## 2023-01-19 DIAGNOSIS — F17200 Nicotine dependence, unspecified, uncomplicated: Secondary | ICD-10-CM

## 2023-02-14 ENCOUNTER — Other Ambulatory Visit: Payer: Self-pay | Admitting: Internal Medicine

## 2023-02-14 DIAGNOSIS — Z720 Tobacco use: Secondary | ICD-10-CM

## 2023-02-14 DIAGNOSIS — R0609 Other forms of dyspnea: Secondary | ICD-10-CM

## 2023-02-14 DIAGNOSIS — R634 Abnormal weight loss: Secondary | ICD-10-CM

## 2023-02-19 ENCOUNTER — Ambulatory Visit
Admission: RE | Admit: 2023-02-19 | Discharge: 2023-02-19 | Disposition: A | Discharge status: Home / Self Care | Payer: No Typology Code available for payment source | Source: Ambulatory Visit | Attending: Internal Medicine | Admitting: Internal Medicine

## 2023-02-19 DIAGNOSIS — Z720 Tobacco use: Secondary | ICD-10-CM

## 2023-02-19 DIAGNOSIS — R0609 Other forms of dyspnea: Secondary | ICD-10-CM

## 2023-02-19 DIAGNOSIS — R634 Abnormal weight loss: Secondary | ICD-10-CM

## 2023-03-07 ENCOUNTER — Other Ambulatory Visit: Payer: Self-pay

## 2023-03-07 ENCOUNTER — Encounter: Payer: Self-pay | Admitting: Emergency Medicine

## 2023-03-07 ENCOUNTER — Emergency Department: Payer: MEDICAID

## 2023-03-07 ENCOUNTER — Inpatient Hospital Stay
Admission: EM | Admit: 2023-03-07 | Discharge: 2023-03-09 | DRG: 281 | Disposition: A | Discharge status: Home / Self Care | Payer: MEDICAID | Attending: Internal Medicine | Admitting: Internal Medicine

## 2023-03-07 DIAGNOSIS — Z7902 Long term (current) use of antithrombotics/antiplatelets: Secondary | ICD-10-CM

## 2023-03-07 DIAGNOSIS — K219 Gastro-esophageal reflux disease without esophagitis: Secondary | ICD-10-CM | POA: Diagnosis present

## 2023-03-07 DIAGNOSIS — J189 Pneumonia, unspecified organism: Secondary | ICD-10-CM | POA: Diagnosis present

## 2023-03-07 DIAGNOSIS — I10 Essential (primary) hypertension: Secondary | ICD-10-CM | POA: Diagnosis present

## 2023-03-07 DIAGNOSIS — I214 Non-ST elevation (NSTEMI) myocardial infarction: Principal | ICD-10-CM | POA: Diagnosis present

## 2023-03-07 DIAGNOSIS — J439 Emphysema, unspecified: Secondary | ICD-10-CM | POA: Diagnosis present

## 2023-03-07 DIAGNOSIS — Z8249 Family history of ischemic heart disease and other diseases of the circulatory system: Secondary | ICD-10-CM

## 2023-03-07 DIAGNOSIS — I700 Atherosclerosis of aorta: Secondary | ICD-10-CM | POA: Diagnosis present

## 2023-03-07 DIAGNOSIS — Z6830 Body mass index (BMI) 30.0-30.9, adult: Secondary | ICD-10-CM

## 2023-03-07 DIAGNOSIS — J219 Acute bronchiolitis, unspecified: Secondary | ICD-10-CM | POA: Diagnosis present

## 2023-03-07 DIAGNOSIS — J440 Chronic obstructive pulmonary disease with acute lower respiratory infection: Secondary | ICD-10-CM | POA: Diagnosis present

## 2023-03-07 DIAGNOSIS — F17200 Nicotine dependence, unspecified, uncomplicated: Secondary | ICD-10-CM | POA: Diagnosis present

## 2023-03-07 DIAGNOSIS — I2510 Atherosclerotic heart disease of native coronary artery without angina pectoris: Secondary | ICD-10-CM | POA: Diagnosis present

## 2023-03-07 DIAGNOSIS — E785 Hyperlipidemia, unspecified: Secondary | ICD-10-CM | POA: Diagnosis present

## 2023-03-07 DIAGNOSIS — E669 Obesity, unspecified: Secondary | ICD-10-CM | POA: Diagnosis present

## 2023-03-07 DIAGNOSIS — E876 Hypokalemia: Secondary | ICD-10-CM | POA: Insufficient documentation

## 2023-03-07 DIAGNOSIS — Z7982 Long term (current) use of aspirin: Secondary | ICD-10-CM

## 2023-03-07 DIAGNOSIS — Z79899 Other long term (current) drug therapy: Secondary | ICD-10-CM

## 2023-03-07 DIAGNOSIS — J849 Interstitial pulmonary disease, unspecified: Secondary | ICD-10-CM

## 2023-03-07 DIAGNOSIS — Z800 Family history of malignant neoplasm of digestive organs: Secondary | ICD-10-CM

## 2023-03-07 DIAGNOSIS — F17210 Nicotine dependence, cigarettes, uncomplicated: Secondary | ICD-10-CM | POA: Diagnosis present

## 2023-03-07 HISTORY — DX: Hyperlipidemia, unspecified: E78.5

## 2023-03-07 HISTORY — DX: Nicotine dependence, unspecified, uncomplicated: F17.200

## 2023-03-07 LAB — CBC
HCT: 47.9 % (ref 39.0–52.0)
Hemoglobin: 15.9 g/dL (ref 13.0–17.0)
MCH: 28.7 pg (ref 26.0–34.0)
MCHC: 33.2 g/dL (ref 30.0–36.0)
MCV: 86.5 fL (ref 80.0–100.0)
Platelets: 349 10*3/uL (ref 150–400)
RBC: 5.54 MIL/uL (ref 4.22–5.81)
RDW: 12.6 % (ref 11.5–15.5)
WBC: 10.3 10*3/uL (ref 4.0–10.5)
nRBC: 0 % (ref 0.0–0.2)

## 2023-03-07 LAB — BASIC METABOLIC PANEL
Anion gap: 13 (ref 5–15)
BUN: 17 mg/dL (ref 6–20)
CO2: 22 mmol/L (ref 22–32)
Calcium: 9.3 mg/dL (ref 8.9–10.3)
Chloride: 98 mmol/L (ref 98–111)
Creatinine, Ser: 0.93 mg/dL (ref 0.61–1.24)
GFR, Estimated: >60 mL/min (ref >60)
Glucose, Bld: 108 mg/dL — ABNORMAL HIGH (ref 70–99)
Potassium: 3.8 mmol/L (ref 3.5–5.1)
Sodium: 133 mmol/L — ABNORMAL LOW (ref 135–145)

## 2023-03-07 LAB — TROPONIN I (HIGH SENSITIVITY)
Troponin I (High Sensitivity): 193 ng/L (ref <18)
Troponin I (High Sensitivity): 213 ng/L (ref <18)
Troponin I (High Sensitivity): 258 ng/L (ref <18)

## 2023-03-07 LAB — HEMOGLOBIN A1C
Hgb A1c MFr Bld: 5.6 % (ref 4.8–5.6)
Mean Plasma Glucose: 114.02 mg/dL

## 2023-03-07 LAB — HIV ANTIBODY (ROUTINE TESTING W REFLEX): HIV Screen 4th Generation wRfx: NONREACTIVE

## 2023-03-07 LAB — PROTIME-INR
INR: 1 (ref 0.8–1.2)
Prothrombin Time: 13.3 s (ref 11.4–15.2)

## 2023-03-07 LAB — APTT: aPTT: 30 s (ref 24–36)

## 2023-03-07 MED ORDER — ACETAMINOPHEN 325 MG PO TABS
650.0000 mg | ORAL_TABLET | Freq: Four times a day (QID) | ORAL | Status: DC | PRN
  Administered 2023-03-08: 650 mg via ORAL
  Filled 2023-03-07 (×2): qty 2

## 2023-03-07 MED ORDER — ASPIRIN 81 MG PO CHEW
324.0000 mg | CHEWABLE_TABLET | Freq: Once | ORAL | Status: AC
  Administered 2023-03-07: 324 mg via ORAL
  Filled 2023-03-07: qty 4

## 2023-03-07 MED ORDER — ONDANSETRON HCL 4 MG/2ML IJ SOLN: 4.0000 mg | Freq: Three times a day (TID) | INTRAMUSCULAR | Status: DC | PRN

## 2023-03-07 MED ORDER — ASPIRIN 325 MG PO TBEC
325.0000 mg | DELAYED_RELEASE_TABLET | Freq: Every day | ORAL | Status: DC
  Administered 2023-03-08: 325 mg via ORAL
  Filled 2023-03-07: qty 1

## 2023-03-07 MED ORDER — MORPHINE SULFATE (PF) 2 MG/ML IV SOLN: 2.0000 mg | INTRAVENOUS | Status: DC | PRN

## 2023-03-07 MED ORDER — ALBUTEROL SULFATE HFA 108 (90 BASE) MCG/ACT IN AERS: 2.0000 | INHALATION_SPRAY | RESPIRATORY_TRACT | Status: DC | PRN

## 2023-03-07 MED ORDER — HEPARIN (PORCINE) 25000 UT/250ML-% IV SOLN
1650.0000 [IU]/h | INTRAVENOUS | Status: DC
  Administered 2023-03-07: 1100 [IU]/h via INTRAVENOUS
  Filled 2023-03-07 (×3): qty 250

## 2023-03-07 MED ORDER — OXYCODONE-ACETAMINOPHEN 5-325 MG PO TABS: 1.0000 | ORAL_TABLET | ORAL | Status: DC | PRN

## 2023-03-07 MED ORDER — DM-GUAIFENESIN ER 30-600 MG PO TB12: 1.0000 | ORAL_TABLET | Freq: Two times a day (BID) | ORAL | Status: DC | PRN

## 2023-03-07 MED ORDER — ATORVASTATIN CALCIUM 20 MG PO TABS
80.0000 mg | ORAL_TABLET | Freq: Every day | ORAL | Status: DC
  Administered 2023-03-07: 80 mg via ORAL
  Filled 2023-03-07: qty 4

## 2023-03-07 MED ORDER — ALBUTEROL SULFATE (2.5 MG/3ML) 0.083% IN NEBU
2.5000 mg | INHALATION_SOLUTION | RESPIRATORY_TRACT | Status: DC | PRN
  Filled 2023-03-07: qty 3

## 2023-03-07 MED ORDER — HYDRALAZINE HCL 20 MG/ML IJ SOLN: 5.0000 mg | INTRAMUSCULAR | Status: DC | PRN

## 2023-03-07 MED ORDER — HEPARIN BOLUS VIA INFUSION
4000.0000 [IU] | Freq: Once | INTRAVENOUS | Status: AC
  Administered 2023-03-07: 4000 [IU] via INTRAVENOUS
  Filled 2023-03-07: qty 4000

## 2023-03-07 MED ORDER — NITROGLYCERIN 0.4 MG SL SUBL: 0.4000 mg | SUBLINGUAL_TABLET | SUBLINGUAL | Status: DC | PRN

## 2023-03-07 MED ORDER — NICOTINE 21 MG/24HR TD PT24
21.0000 mg | MEDICATED_PATCH | Freq: Every day | TRANSDERMAL | Status: DC
  Administered 2023-03-07 – 2023-03-08 (×2): 21 mg via TRANSDERMAL
  Filled 2023-03-07 (×2): qty 1

## 2023-03-07 NOTE — ED Notes (Signed)
Report given to Ashley, Charity fundraiser. Pt moved to room 34. Care relinquished at this time

## 2023-03-07 NOTE — ED Notes (Signed)
Trop of 193 informed to MD [facility]. Charge RN Marylene Land informed and pt acuity changed.

## 2023-03-07 NOTE — ED Provider Notes (Addendum)
[facility] Provider Note    Event Date/Time   First MD Initiated Contact with Patient 03/07/23 1814     (approximate)   History   Chief Complaint: Chest Pain   HPI  Gerald George is a 58 y.o. male with a history of hypertension, GERD who comes ED complaining of central chest pain that started at about 345 today, associated with shortness of breath and diaphoresis, lasted 45 minutes.  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  Patient reports being in his usual state of health until 5 days ago when he experienced severe central chest pain after digging holes which was very strenuous.  He went inside and lay down, pain subsided after a few hours, but he felt very low energy for the next 48 hours.  Does not see a cardiologist.        Physical Exam   Triage Vital Signs: ED Triage Vitals  Encounter Vitals Group     BP 03/07/23 1708 (!) 182/104     Systolic BP Percentile --      Diastolic BP Percentile --      Pulse Rate 03/07/23 1708 86     Resp 03/07/23 1708 18     Temp 03/07/23 1708 97.9 F (36.6 C)     Temp Source 03/07/23 1708 Oral     SpO2 03/07/23 1708 96 %     Weight 03/07/23 1707 192 lb (87.1 kg)     Height 03/07/23 1707 5\' 7"  (1.702 m)     Head Circumference --      Peak Flow --      Pain Score 03/07/23 1707 7     Pain Loc --      Pain Education --      Exclude from Growth Chart --     Most recent vital signs: Vitals:   03/07/23 1815 03/07/23 1830  BP: (!) 153/100 (!) 153/100  Pulse: 75 75  Resp:  18  Temp:  98 F (36.7 C)  SpO2: 97% 97%    General: Awake, no distress.  CV:  Good peripheral perfusion.  Regular rate rhythm Resp:  Normal effort.  Clear to auscultation bilaterally Abd:  No distention.  Soft nontender Other:  No lower extremity edema   ED Results / Procedures / Treatments   Labs (all labs ordered are listed, but only abnormal results are displayed) Labs Reviewed  BASIC METABOLIC PANEL -  Abnormal; Notable for the following components:      Result Value   Sodium 133 (*)    Glucose, Bld 108 (*)    All other components within normal limits  TROPONIN I (HIGH SENSITIVITY) - Abnormal; Notable for the following components:   Troponin I (High Sensitivity) 193 (*)    All other components within normal limits  TROPONIN I (HIGH SENSITIVITY) - Abnormal; Notable for the following components:   Troponin I (High Sensitivity) 213 (*)    All other components within normal limits  CBC  APTT  PROTIME-INR  HEMOGLOBIN A1C  LIPID PANEL  URINE DRUG SCREEN, QUALITATIVE ([facility] ONLY)  HIV ANTIBODY (ROUTINE TESTING W REFLEX)  BASIC METABOLIC PANEL  CBC  TROPONIN I (HIGH SENSITIVITY)     EKG Interpreted by me Sinus rhythm rate of 80.  Left axis, normal intervals.  No acute ischemic changes.   RADIOLOGY Chest x-ray interpreted by me, unremarkable.  Radiology report reviewed   PROCEDURES:  .Critical Care  Performed by: Sharman Cheek, MD Authorized by: Scotty Court,  Aneta Mins, MD   Critical care provider statement:    Critical care time (minutes):  35   Critical care time was exclusive of:  Separately billable procedures and treating other patients   Critical care was necessary to treat or prevent imminent or life-threatening deterioration of the following conditions:  Cardiac failure   Critical care was time spent personally by me on the following activities:  Development of treatment plan with patient or surrogate, discussions with consultants, evaluation of patient's response to treatment, examination of patient, obtaining history from patient or surrogate, ordering and performing treatments and interventions, ordering and review of laboratory studies, ordering and review of radiographic studies, pulse oximetry, re-evaluation of patient's condition and review of old charts   Care discussed with: admitting provider      MEDICATIONS ORDERED IN ED: Medications  heparin ADULT infusion  100 units/mL (25000 units/219mL) (1,100 Units/hr Intravenous New Bag/Given 03/07/23 1944)  dextromethorphan-guaiFENesin (MUCINEX DM) 30-600 MG per 12 hr tablet 1 tablet (has no administration in time range)  nitroGLYCERIN (NITROSTAT) SL tablet 0.4 mg (has no administration in time range)  morphine (PF) 2 MG/ML injection 2 mg (has no administration in time range)  oxyCODONE-acetaminophen (PERCOCET/ROXICET) 5-325 MG per tablet 1 tablet (has no administration in time range)  ondansetron (ZOFRAN) injection 4 mg (has no administration in time range)  hydrALAZINE (APRESOLINE) injection 5 mg (has no administration in time range)  acetaminophen (TYLENOL) tablet 650 mg (has no administration in time range)  nicotine (NICODERM CQ - dosed in mg/24 hours) patch 21 mg (21 mg Transdermal Patch Applied 03/07/23 1936)  aspirin EC tablet 325 mg (has no administration in time range)  atorvastatin (LIPITOR) tablet 80 mg (80 mg Oral Given 03/07/23 1936)  albuterol (PROVENTIL) (2.5 MG/3ML) 0.083% nebulizer solution 2.5 mg (has no administration in time range)  aspirin chewable tablet 324 mg (324 mg Oral Given 03/07/23 1934)  heparin bolus via infusion 4,000 Units (4,000 Units Intravenous Bolus from Bag 03/07/23 1944)     IMPRESSION / MDM / ASSESSMENT AND PLAN / ED COURSE  I reviewed the triage vital signs and the nursing notes.  DDx: Pneumonia, pneumothorax, non-STEMI, GERD  Patient's presentation is most consistent with acute presentation with potential threat to life or bodily function.  Patient presents with central chest pain that seems to be provoked by exertion.  Worrisome for NSTEMI.  Currently chest pain is resolved.  Vital signs unremarkable, exam reassuring.  Doubt PE or dissection.  Labs reveal troponin of 193.  Record review shows that October 13, 2020 patient had troponin measurement at an outside hospital which was negative at that time.  concern for NSTEMI.  Will start heparin, give aspirin, admit  for further cardiac workup.   ----------------------------------------- 8:06 PM on 03/07/2023 ----------------------------------------- Case discussed with hospitalist      FINAL CLINICAL IMPRESSION(S) / ED DIAGNOSES   Final diagnoses:  NSTEMI (non-ST elevated myocardial infarction) (HCC)     Rx / DC Orders   ED Discharge Orders     None        Note:  This document was prepared using Dragon voice recognition software and may include unintentional dictation errors.   Sharman Cheek, MD 03/07/23 1846    Sharman Cheek, MD 03/07/23 2006

## 2023-03-07 NOTE — Consult Note (Signed)
PHARMACY - ANTICOAGULATION CONSULT NOTE  Pharmacy Consult for heparin infusion Indication: chest pain/ACS  No Known Allergies  Patient Measurements: Height: 5\' 7"  (170.2 cm) Weight: 87.1 kg (192 lb) IBW/kg (Calculated) : 66.1 Heparin Dosing Weight: 84 kg  Vital Signs: Temp: 98 F (36.7 C) (02/12 1830) Temp Source: Oral (02/12 1830) BP: 153/100 (02/12 1830) Pulse Rate: 75 (02/12 1830)  Labs: Recent Labs    03/07/23 1709  HGB 15.9  HCT 47.9  PLT 349  CREATININE 0.93  TROPONINIHS 193*    Estimated Creatinine Clearance: 90.1 mL/min (by C-G formula based on SCr of 0.93 mg/dL).   Medical History: Past Medical History:  Diagnosis Date   GERD (gastroesophageal reflux disease)    Hypertension     Medications:  No home anticoagulants per pharmacist review  Assessment: 58 yo male presented to the ED with primary complaint of chest pain. In ED patient found to have elevated troponin.  Pharmacy consulted to initiate heparin infusion.  Goal of Therapy:  Heparin level 0.3-0.7 units/ml Monitor platelets by anticoagulation protocol: Yes   Plan:  Give 4000 units bolus x 1 Start heparin infusion at 1100 units/hr Check anti-Xa level in 6 hours and daily while on heparin Continue to monitor H&H and platelets  Barrie Folk, PharmD 03/07/2023,6:40 PM

## 2023-03-07 NOTE — ED Triage Notes (Signed)
Patient to ED via POV for CP- states generalized across chest. States burning pain. Intermittent pain since Friday. Denies cardiac hx. States pain is worse with movement. Hx HTN

## 2023-03-07 NOTE — H&P (Signed)
History and Physical    Gerald George ZOX:[id number] DOB: [birth year] DOA: 03/07/2023  Referring MD/NP/PA:   PCP: Danella Penton, MD   Patient coming from:  The patient is coming from home.     Chief Complaint: chest pain  HPI: Gerald George is a 58 y.o. male with medical history significant of smoker, HTN, GERD, obesity with BMI 30.07, who presents with chest pain.  Patient states that he has intermittent chest pain in the past 5 days.  The chest pain is exertional.  Associated with SOB and diaphoresis.    Data reviewed independently and ED Course: pt was found to have troponin 193, GFR> 60, WBC 10.3, temperature normal, blood pressure 153/100, heart rate 86, RR 18, O2 sat 97% on room air.  Chest x-ray negative.  Patient is placed on telemetry bed for observation.  Dr. Juliann Pares of cardiology is consulted.   EKG: I have personally reviewed.  Sinus rhythm, QTc 426, LAD, poor R wave progression.   Review of Systems:   General: no fevers, chills, no body weight gain, has fatigue HEENT: no blurry vision, hearing changes or sore throat Respiratory: no dyspnea, coughing, wheezing CV: has chest pain, no palpitations GI: no nausea, vomiting, abdominal pain, diarrhea, constipation GU: no dysuria, burning on urination, increased urinary frequency, hematuria  Ext: no leg edema Neuro: no unilateral weakness, numbness, or tingling, no vision change or hearing loss Skin: no rash, no skin tear. MSK: No muscle spasm, no deformity, no limitation of range of movement in spin Heme: No easy bruising.  Travel history: No recent long distant travel.   Allergy: No Known Allergies  Past Medical History:  Diagnosis Date   GERD (gastroesophageal reflux disease)    Hypertension    Smoker     History reviewed. No pertinent surgical history.  Social History:  reports that he has been smoking cigarettes. He has never used smokeless tobacco. He reports that he does not drink alcohol and does  not use drugs.  Family History: History reviewed. No pertinent family history.   Prior to Admission medications   Medication Sig Start Date End Date Taking? Authorizing Provider  albuterol (PROVENTIL HFA;VENTOLIN HFA) 108 (90 Base) MCG/ACT inhaler Inhale 2 puffs into the lungs every 6 (six) hours as needed for wheezing or shortness of breath. 02/22/17   Rockne Menghini, MD  benzonatate (TESSALON PERLES) 100 MG capsule Take 1 capsule (100 mg total) by mouth every 6 (six) hours as needed for cough. 02/22/17   Rockne Menghini, MD  famotidine (PEPCID) 20 MG tablet Take 1 tablet (20 mg total) by mouth 2 (two) times daily. 09/14/15   Sharman Cheek, MD  predniSONE (DELTASONE) 20 MG tablet Take 3 tablets (60 mg total) by mouth daily. 02/22/17   Rockne Menghini, MD  sucralfate (CARAFATE) 1 g tablet Take 1 tablet (1 g total) by mouth 4 (four) times daily. 09/14/15   Sharman Cheek, MD    Physical Exam: Vitals:   03/07/23 1708 03/07/23 1815 03/07/23 1830 03/07/23 2000  BP: (!) 182/104 (!) 153/100 (!) 153/100 (!) 146/99  Pulse: 86 75 75 72  Resp: 18  18 (!) 26  Temp: 97.9 F (36.6 C)  98 F (36.7 C)   TempSrc: Oral  Oral   SpO2: 96% 97% 97% 97%  Weight:      Height:       General: Not in acute distress HEENT:       Eyes: PERRL, EOMI, no jaundice       ENT:  No discharge from the ears and nose, no pharynx injection, no tonsillar enlargement.        Neck: No JVD, no bruit, no mass felt. Heme: No neck lymph node enlargement. Cardiac: S1/S2, RRR, No murmurs, No gallops or rubs. Respiratory: No rales, wheezing, rhonchi or rubs. GI: Soft, nondistended, nontender, no rebound pain, no organomegaly, BS present. GU: No hematuria Ext: No pitting leg edema bilaterally. 1+DP/PT pulse bilaterally. Musculoskeletal: No joint deformities, No joint redness or warmth, no limitation of ROM in spin. Skin: No rashes.  Neuro: Alert, oriented X3, cranial nerves II-XII grossly intact, moves all  extremities normally. Psych: Patient is not psychotic, no suicidal or hemocidal ideation.  Labs on Admission: I have personally reviewed following labs and imaging studies  CBC: Recent Labs  Lab 03/07/23 1709  WBC 10.3  HGB 15.9  HCT 47.9  MCV 86.5  PLT 349   Basic Metabolic Panel: Recent Labs  Lab 03/07/23 1709  NA 133*  K 3.8  CL 98  CO2 22  GLUCOSE 108*  BUN 17  CREATININE 0.93  CALCIUM 9.3   GFR: Estimated Creatinine Clearance: 90.1 mL/min (by C-G formula based on SCr of 0.93 mg/dL). Liver Function Tests: No results for input(s): "AST", "ALT", "ALKPHOS", "BILITOT", "PROT", "ALBUMIN" in the last 168 hours. No results for input(s): "LIPASE", "AMYLASE" in the last 168 hours. No results for input(s): "AMMONIA" in the last 168 hours. Coagulation Profile: Recent Labs  Lab 03/07/23 1844  INR 1.0   Cardiac Enzymes: No results for input(s): "CKTOTAL", "CKMB", "CKMBINDEX", "TROPONINI" in the last 168 hours. BNP (last 3 results) No results for input(s): "PROBNP" in the last 8760 hours. HbA1C: No results for input(s): "HGBA1C" in the last 72 hours. CBG: No results for input(s): "GLUCAP" in the last 168 hours. Lipid Profile: No results for input(s): "CHOL", "HDL", "LDLCALC", "TRIG", "CHOLHDL", "LDLDIRECT" in the last 72 hours. Thyroid Function Tests: No results for input(s): "TSH", "T4TOTAL", "FREET4", "T3FREE", "THYROIDAB" in the last 72 hours. Anemia Panel: No results for input(s): "VITAMINB12", "FOLATE", "FERRITIN", "TIBC", "IRON", "RETICCTPCT" in the last 72 hours. Urine analysis:    Component Value Date/Time   COLORURINE YELLOW 02/02/2010 2026   APPEARANCEUR CLEAR 02/02/2010 2026   LABSPEC 1.013 02/02/2010 2026   PHURINE 7.0 02/02/2010 2026   HGBUR NEGATIVE 02/02/2010 2026   BILIRUBINUR NEGATIVE 02/02/2010 2026   KETONESUR NEGATIVE 02/02/2010 2026   PROTEINUR NEGATIVE 02/02/2010 2026   UROBILINOGEN 1.0 02/02/2010 2026   NITRITE NEGATIVE 02/02/2010 2026    LEUKOCYTESUR  02/02/2010 2026    NEGATIVE MICROSCOPIC NOT DONE ON URINES WITH NEGATIVE PROTEIN, BLOOD, LEUKOCYTES, NITRITE, OR GLUCOSE <1000 mg/dL.   Sepsis Labs: @LABRCNTIP (procalcitonin:4,lacticidven:4) )No results found for this or any previous visit (from the past 240 hours).   Radiological Exams on Admission:   Assessment/Plan Principal Problem:   NSTEMI (non-ST elevated myocardial infarction) (HCC) Active Problems:   GERD (gastroesophageal reflux disease)   Smoker   Obesity (BMI 30-39.9)   Assessment and Plan:  NSTEMI (non-ST elevated myocardial infarction) (HCC):  60M, hx of smoker, HTN, GERD, obesity with BMI 30.07, admitted due to intermittent chest pain in the past 5 days, troponin 193, started IV heparin.    Active Problems:   GERD (gastroesophageal reflux disease)   Smoker   Obesity (BMI 30-39.9)    DVT ppx: on IV Heparin    Code Status: Full code   ***  Family Communication:     not done, no family member is at bed side.  Yes, patient's    at bed side.       by phone   ***  Disposition Plan:  Anticipate discharge back to previous environment  Consults called:   Dr. Juliann Pares of cardiology is consulted.  Admission status and Level of care: Telemetry Cardiac:    for obs as inpt        Dispo: The patient is from: {From:23814}              Anticipated d/c is to: {To:23815}              Anticipated d/c date is: {Days:23816}              Patient currently {Medically stable:23817}    Severity of Illness:  {Observation/Inpatient:21159}       Date of Service 03/07/2023    Lorretta Harp [facility]   If 7PM-7AM, please contact night-coverage [email or web address] 03/07/2023, 8:36 PM

## 2023-03-08 ENCOUNTER — Encounter: Payer: Self-pay | Admitting: Internal Medicine

## 2023-03-08 DIAGNOSIS — K219 Gastro-esophageal reflux disease without esophagitis: Secondary | ICD-10-CM

## 2023-03-08 DIAGNOSIS — E785 Hyperlipidemia, unspecified: Secondary | ICD-10-CM | POA: Diagnosis present

## 2023-03-08 DIAGNOSIS — J189 Pneumonia, unspecified organism: Secondary | ICD-10-CM | POA: Diagnosis present

## 2023-03-08 DIAGNOSIS — E876 Hypokalemia: Secondary | ICD-10-CM | POA: Insufficient documentation

## 2023-03-08 DIAGNOSIS — J849 Interstitial pulmonary disease, unspecified: Secondary | ICD-10-CM

## 2023-03-08 LAB — URINE DRUG SCREEN, QUALITATIVE (ARMC ONLY)
Amphetamines, Ur Screen: NOT DETECTED
Barbiturates, Ur Screen: NOT DETECTED
Benzodiazepine, Ur Scrn: NOT DETECTED
Cannabinoid 50 Ng, Ur ~~LOC~~: NOT DETECTED
Cocaine Metabolite,Ur ~~LOC~~: NOT DETECTED
MDMA (Ecstasy)Ur Screen: NOT DETECTED
Methadone Scn, Ur: NOT DETECTED
Opiate, Ur Screen: NOT DETECTED
Phencyclidine (PCP) Ur S: NOT DETECTED
Tricyclic, Ur Screen: NOT DETECTED

## 2023-03-08 LAB — TROPONIN I (HIGH SENSITIVITY)
Troponin I (High Sensitivity): 311 ng/L (ref <18)
Troponin I (High Sensitivity): 264 ng/L (ref <18)
Troponin I (High Sensitivity): 141 ng/L (ref <18)

## 2023-03-08 LAB — EXPECTORATED SPUTUM ASSESSMENT W GRAM STAIN, RFLX TO RESP C

## 2023-03-08 LAB — BASIC METABOLIC PANEL
Anion gap: 10 (ref 5–15)
BUN: 17 mg/dL (ref 6–20)
CO2: 23 mmol/L (ref 22–32)
Calcium: 8.6 mg/dL — ABNORMAL LOW (ref 8.9–10.3)
Chloride: 102 mmol/L (ref 98–111)
Creatinine, Ser: 0.83 mg/dL (ref 0.61–1.24)
GFR, Estimated: >60 mL/min (ref >60)
Glucose, Bld: 98 mg/dL (ref 70–99)
Potassium: 3.4 mmol/L — ABNORMAL LOW (ref 3.5–5.1)
Sodium: 135 mmol/L (ref 135–145)

## 2023-03-08 LAB — CBC
HCT: 41.7 % (ref 39.0–52.0)
Hemoglobin: 14.3 g/dL (ref 13.0–17.0)
MCH: 28.6 pg (ref 26.0–34.0)
MCHC: 34.3 g/dL (ref 30.0–36.0)
MCV: 83.4 fL (ref 80.0–100.0)
Platelets: 322 10*3/uL (ref 150–400)
RBC: 5 MIL/uL (ref 4.22–5.81)
RDW: 12.7 % (ref 11.5–15.5)
WBC: 11.1 10*3/uL — ABNORMAL HIGH (ref 4.0–10.5)
nRBC: 0 % (ref 0.0–0.2)

## 2023-03-08 LAB — LIPID PANEL
Cholesterol: 199 mg/dL (ref 0–200)
HDL: 35 mg/dL — ABNORMAL LOW (ref >40)
LDL Cholesterol: 133 mg/dL — ABNORMAL HIGH (ref 0–99)
Total CHOL/HDL Ratio: 5.7 {ratio}
Triglycerides: 157 mg/dL — ABNORMAL HIGH (ref <150)
VLDL: 31 mg/dL (ref 0–40)

## 2023-03-08 LAB — HEPARIN LEVEL (UNFRACTIONATED)
Heparin Unfractionated: 0.12 [IU]/mL — ABNORMAL LOW (ref 0.30–0.70)
Heparin Unfractionated: 0.18 [IU]/mL — ABNORMAL LOW (ref 0.30–0.70)
Heparin Unfractionated: 0.36 [IU]/mL (ref 0.30–0.70)

## 2023-03-08 MED ORDER — METOPROLOL SUCCINATE ER 25 MG PO TB24
25.0000 mg | ORAL_TABLET | Freq: Every day | ORAL | Status: DC
  Administered 2023-03-08: 25 mg via ORAL
  Filled 2023-03-08: qty 1

## 2023-03-08 MED ORDER — ROSUVASTATIN CALCIUM 20 MG PO TABS
20.0000 mg | ORAL_TABLET | Freq: Every day | ORAL | Status: DC
  Administered 2023-03-08: 20 mg via ORAL
  Filled 2023-03-08: qty 1

## 2023-03-08 MED ORDER — IPRATROPIUM-ALBUTEROL 0.5-2.5 (3) MG/3ML IN SOLN
3.0000 mL | RESPIRATORY_TRACT | Status: DC
  Administered 2023-03-08 – 2023-03-09 (×4): 3 mL via RESPIRATORY_TRACT
  Filled 2023-03-08 (×6): qty 3

## 2023-03-08 MED ORDER — PANTOPRAZOLE SODIUM 40 MG PO TBEC
40.0000 mg | DELAYED_RELEASE_TABLET | Freq: Every day | ORAL | Status: DC
  Administered 2023-03-08: 40 mg via ORAL
  Filled 2023-03-08: qty 1

## 2023-03-08 MED ORDER — LISINOPRIL-HYDROCHLOROTHIAZIDE 20-12.5 MG PO TABS: 1.0000 | ORAL_TABLET | Freq: Every day | ORAL | Status: DC

## 2023-03-08 MED ORDER — METHYLPREDNISOLONE SODIUM SUCC 40 MG IJ SOLR
40.0000 mg | Freq: Two times a day (BID) | INTRAMUSCULAR | Status: DC
  Administered 2023-03-08 (×2): 40 mg via INTRAVENOUS
  Filled 2023-03-08 (×2): qty 1

## 2023-03-08 MED ORDER — ROSUVASTATIN CALCIUM 20 MG PO TABS
40.0000 mg | ORAL_TABLET | Freq: Every day | ORAL | Status: DC
Start: 2023-03-09 — End: 2023-03-09
  Filled 2023-03-08: qty 2

## 2023-03-08 MED ORDER — HEPARIN BOLUS VIA INFUSION
2500.0000 [IU] | Freq: Once | INTRAVENOUS | Status: AC
  Administered 2023-03-08: 2500 [IU] via INTRAVENOUS
  Filled 2023-03-08: qty 2500

## 2023-03-08 MED ORDER — IPRATROPIUM-ALBUTEROL 0.5-2.5 (3) MG/3ML IN SOLN
RESPIRATORY_TRACT | Status: AC
  Administered 2023-03-08: 3 mL via RESPIRATORY_TRACT
  Filled 2023-03-08: qty 3

## 2023-03-08 MED ORDER — LEVOFLOXACIN 500 MG PO TABS
500.0000 mg | ORAL_TABLET | Freq: Every day | ORAL | Status: AC
  Administered 2023-03-08: 500 mg via ORAL
  Filled 2023-03-08: qty 1

## 2023-03-08 MED ORDER — CYANOCOBALAMIN 500 MCG PO TABS
1000.0000 ug | ORAL_TABLET | Freq: Every day | ORAL | Status: DC
  Administered 2023-03-08: 1000 ug via ORAL
  Filled 2023-03-08: qty 2

## 2023-03-08 MED ORDER — ASPIRIN 81 MG PO TBEC: 81.0000 mg | DELAYED_RELEASE_TABLET | Freq: Every day | ORAL | Status: DC

## 2023-03-08 MED ORDER — POTASSIUM CHLORIDE CRYS ER 20 MEQ PO TBCR
40.0000 meq | EXTENDED_RELEASE_TABLET | Freq: Once | ORAL | Status: AC
  Administered 2023-03-08: 40 meq via ORAL
  Filled 2023-03-08: qty 2

## 2023-03-08 MED ORDER — HYDROCHLOROTHIAZIDE 12.5 MG PO TABS
12.5000 mg | ORAL_TABLET | Freq: Every day | ORAL | Status: DC
  Administered 2023-03-08: 12.5 mg via ORAL
  Filled 2023-03-08: qty 1

## 2023-03-08 MED ORDER — LISINOPRIL 10 MG PO TABS
20.0000 mg | ORAL_TABLET | Freq: Every day | ORAL | Status: DC
  Administered 2023-03-08: 20 mg via ORAL
  Filled 2023-03-08: qty 2

## 2023-03-08 NOTE — Assessment & Plan Note (Signed)
Nicotine patch

## 2023-03-08 NOTE — Consult Note (Signed)
PHARMACY - ANTICOAGULATION CONSULT NOTE  Pharmacy Consult for heparin infusion Indication: chest pain/ACS  No Known Allergies  Patient Measurements: Height: 5\' 7"  (170.2 cm) Weight: 87.1 kg (192 lb) IBW/kg (Calculated) : 66.1 Heparin Dosing Weight: 84 kg  Vital Signs: Temp: 97.5 F (36.4 C) (02/13 0907) Temp Source: Oral (02/13 0907) BP: 121/87 (02/13 0907) Pulse Rate: 83 (02/13 0800)  Labs: Recent Labs    03/07/23 1709 03/07/23 1836 03/07/23 1844 03/07/23 1850 03/08/23 0104 03/08/23 0327 03/08/23 0539 03/08/23 0853  HGB 15.9  --   --   --  14.3  --   --   --   HCT 47.9  --   --   --  41.7  --   --   --   PLT 349  --   --   --  322  --   --   --   APTT  --  30  --   --   --   --   --   --   LABPROT  --   --  13.3  --   --   --   --   --   INR  --   --  1.0  --   --   --   --   --   HEPARINUNFRC  --   --   --   --  0.12*  --   --  0.18*  CREATININE 0.93  --   --   --  0.83  --   --   --   TROPONINIHS 193*  --   --    < > 311* 264* 141*  --    < > = values in this interval not displayed.    Estimated Creatinine Clearance: 101 mL/min (by C-G formula based on SCr of 0.83 mg/dL).   Medical History: Past Medical History:  Diagnosis Date   GERD (gastroesophageal reflux disease)    HLD (hyperlipidemia)    Hypertension    Smoker    Medications:  No home anticoagulants per pharmacist review  Assessment: 58 yo male presented to the ED with primary complaint of chest pain. In ED patient found to have elevated troponin.  Pharmacy consulted to initiate heparin infusion.  Goal of Therapy:  Heparin level 0.3-0.7 units/ml Monitor platelets by anticoagulation protocol: Yes  02/13@0104 : HL 0.12, subtherapeutic@1100  units/hr 02/13@0853 : HL 0.18, subtherapeutic on 1350 u/hr   Plan:  Give 2500 units bolus x 1 Increase heparin infusion to 1600 units/hr Check anti-Xa level in 6 hours after rate change and daily while on heparin Continue to monitor H&H and  platelets  Thank you for involving pharmacy in this patient's care.   Rockwell Alexandria, PharmD Clinical Pharmacist 03/08/2023 9:46 AM

## 2023-03-08 NOTE — Consult Note (Signed)
[facility] CARDIOLOGY CONSULT NOTE       Patient ID: Gerald George MRN: [id number] DOB/AGE: [birth year] 60 y.o.  Admit date: 03/07/2023 Referring Physician Dr. Lorretta Harp Primary Physician Danella Penton, MD  Primary Cardiologist None Reason for Consultation NSTEMI  HPI: Salome Cozby is a 58 y.o. male  with a past medical history of hypertension, hyperlipidemia, GERD, tobacco use who presented to the ED on 03/07/2023 for chest pain.  Troponins found to be elevated.  Cardiology was consulted for further evaluation.   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  This was associated with with a burning sensation in his chest.  States that he went home from work at that time and was very fatigued.  Reports that he slept until the afternoon on Saturday.  Throughout the course of the day Saturday he still had chest pain but this was not as severe.  States that on Sunday and Monday he felt better overall with less chest pain but he did not return to work on Monday.  Yesterday he returned to work and was doing more hard labor and had recurrence of chest pain with radiation into both of his arms.  He decided to come to the ED for further evaluation at that time.  Workup in the ED notable for creatinine 0.93, potassium 3.8, hemoglobin 15.9, WBC 10.3.  A1c normal at 5.6.  Troponins trended 193 > 213 > 258 > 311 > 264 > 141.  He was started on IV heparin in the ED.  At the time of my evaluation this morning patient is resting comfortably in ED stretcher.  Reports that overall he feels better now that he has been resting.  We discussed his chest discomfort symptoms in further detail.  He also endorses associated dyspnea on exertion and dizziness with episodes of chest pain.  Denies any palpitations or syncopal episodes.  He states that he has never had any similar symptoms in the past.  Patient reports that he is a smoker for many years but  has been working on quitting.  He had a CT of his chest earlier this month that revealed three-vessel coronary calcification.  Review of systems complete and found to be negative unless listed above    Past Medical History:  Diagnosis Date   GERD (gastroesophageal reflux disease)    HLD (hyperlipidemia)    Hypertension    Smoker     History reviewed. No pertinent surgical history.  (Not in a hospital admission)  Social History   Socioeconomic History   Marital status: Married    Spouse name: Not on file   Number of children: Not on file   Years of education: Not on file   Highest education level: Not on file  Occupational History   Not on file  Tobacco Use   Smoking status: Every Day    Current packs/day: 2.00    Types: Cigarettes   Smokeless tobacco: Never  Substance and Sexual Activity   Alcohol use: No   Drug use: No   Sexual activity: Not on file  Other Topics Concern   Not on file  Social History Narrative   Not on file   Social Drivers of Health   Financial Resource Strain: Medium Risk (01/18/2023)   Received from [facility]   Overall Financial Resource Strain (CARDIA)    Difficulty of Paying Living Expenses: Somewhat hard  Food Insecurity: No  Food Insecurity (01/18/2023)   Received from [facility]   Hunger Vital Sign    Worried About Running Out of Food in the Last Year: Never true    Ran Out of Food in the Last Year: Never true  Transportation Needs: No Transportation Needs (01/18/2023)   Received from [facility] - Transportation    In the past 12 months, has lack of transportation kept you from medical appointments or from getting medications?: No    Lack of Transportation (Non-Medical): No  Physical Activity: Not on file  Stress: Not on file  Social Connections: Not on file  Intimate Partner Violence: Not on file    Family History  Problem Relation Age of Onset   Pancreatic  cancer Father    Heart attack Brother      Vitals:   03/08/23 0907 03/08/23 1012 03/08/23 1128 03/08/23 1137  BP: 121/87  125/89 125/89  Pulse:  (!) 118 94 94  Resp:  (!) 28 (!) 21   Temp:   (!) 97.5 F (36.4 C)   TempSrc:   Oral   SpO2:  92% 94%   Weight:      Height:        PHYSICAL EXAM General: Well-appearing male, well nourished, in no acute distress. HEENT: Normocephalic and atraumatic. Neck: No JVD.  Lungs: Normal respiratory effort on room air. Clear bilaterally to auscultation. No wheezes, crackles, rhonchi.  Heart: HRRR. Normal S1 and S2 without gallops or murmurs\.  Abdomen: Non-distended appearing.  Msk: Normal strength and tone for age. Extremities: Warm and well perfused. No clubbing, cyanosis.  No edema.  Neuro: Alert and oriented X 3. Psych: Answers questions appropriately.   Labs: Basic Metabolic Panel: Recent Labs    03/07/23 1709 03/08/23 0104  NA 133* 135  K 3.8 3.4*  CL 98 102  CO2 22 23  GLUCOSE 108* 98  BUN 17 17  CREATININE 0.93 0.83  CALCIUM 9.3 8.6*   Liver Function Tests: No results for input(s): "AST", "ALT", "ALKPHOS", "BILITOT", "PROT", "ALBUMIN" in the last 72 hours. No results for input(s): "LIPASE", "AMYLASE" in the last 72 hours. CBC: Recent Labs    03/07/23 1709 03/08/23 0104  WBC 10.3 11.1*  HGB 15.9 14.3  HCT 47.9 41.7  MCV 86.5 83.4  PLT 349 322   Cardiac Enzymes: Recent Labs    03/08/23 0104 03/08/23 0327 03/08/23 0539  TROPONINIHS 311* 264* 141*   BNP: No results for input(s): "BNP" in the last 72 hours. D-Dimer: No results for input(s): "DDIMER" in the last 72 hours. Hemoglobin A1C: Recent Labs    03/07/23 1709  HGBA1C 5.6   Fasting Lipid Panel: Recent Labs    03/08/23 0327  CHOL 199  HDL 35*  LDLCALC 133*  TRIG 157*  CHOLHDL 5.7   Thyroid Function Tests: No results for input(s): "TSH", "T4TOTAL", "T3FREE", "THYROIDAB" in the last 72 hours.  Invalid input(s): "FREET3" Anemia Panel: No  results for input(s): "VITAMINB12", "FOLATE", "FERRITIN", "TIBC", "IRON", "RETICCTPCT" in the last 72 hours.   Radiology: DG Chest 2 View Result Date: 03/07/2023 CLINICAL DATA:  Chest pain. EXAM: CHEST - 2 VIEW COMPARISON:  Chest radiograph dated 02/22/2017. FINDINGS: No focal consolidation, pleural effusion, or pneumothorax. The cardiac silhouette is within normal limits. No acute osseous pathology. IMPRESSION: No active cardiopulmonary disease. Electronically Signed   By: Elgie Collard M.D.   On: 03/07/2023 18:51   CT CHEST WO CONTRAST Result Date: 02/27/2023 CLINICAL DATA:  Unexplained weight loss. Dyspnea on exertion. Current smoker. EXAM: CT CHEST WITHOUT CONTRAST TECHNIQUE: Multidetector CT imaging of the chest was performed following the standard protocol without IV contrast. RADIATION DOSE REDUCTION: This exam was performed according to the departmental dose-optimization program which includes automated exposure control, adjustment of the mA and/or kV according to patient size and/or use of iterative reconstruction technique. COMPARISON:  2015/10/17 chest CT angiogram. 02/22/2017 chest radiograph. FINDINGS: Cardiovascular: Normal heart size. No significant pericardial effusion/thickening. Three-vessel coronary atherosclerosis. Atherosclerotic nonaneurysmal thoracic aorta. Normal caliber pulmonary arteries. Mediastinum/Nodes: No significant thyroid nodules. Unremarkable esophagus. No axillary adenopathy. No pathologically enlarged mediastinal or discrete hilar nodes on these noncontrast images. Lungs/Pleura: No pneumothorax. No pleural effusion. Mild centrilobular emphysema with diffuse bronchial wall thickening. No acute consolidative airspace disease, lung masses or significant pulmonary nodules. Mild patchy centrilobular ground-glass micronodularity with mild patchy peripheral reticulation throughout both lungs without a clear apicobasilar gradient. No significant regions of traction  bronchiectasis, architectural distortion or frank honeycombing. Findings are mildly more pronounced than 10/17/15 chest CT. Upper abdomen: No acute abnormality. Musculoskeletal: No aggressive appearing focal osseous lesions. Minimal thoracic spondylosis. IMPRESSION: 1. Mild patchy centrilobular ground-glass micronodularity with mild patchy peripheral reticulation throughout both lungs without a clear apicobasilar gradient. No bronchiectasis or honeycombing. Findings are mildly more pronounced than 2015-10-17 chest CT. Findings are nonspecific, favoring respiratory bronchiolitis-interstitial lung disease (RB-ILD). Other etiologies including early UIP or NSIP are less favored but not entirely excluded. Follow-up high-resolution chest CT may be considered in 6-12 months as clinically warranted. 2. Three-vessel coronary atherosclerosis. 3. Aortic Atherosclerosis (ICD10-I70.0) and Emphysema (ICD10-J43.9). Electronically Signed   By: Delbert Phenix M.D.   On: 02/27/2023 00:23    ECHO pending  TELEMETRY reviewed by me 03/08/2023: Sinus rhythm rate 90s  EKG reviewed by me: Sinus rhythm, left anterior fascicular block rate 80 bpm  Data reviewed by me 03/08/2023: last 24h vitals tele labs imaging I/O ED provider note, admission H&P  Principal Problem:   NSTEMI (non-ST elevated myocardial infarction) (HCC) Active Problems:   Smoker   GERD (gastroesophageal reflux disease)   Obesity (BMI 30-39.9)   HLD (hyperlipidemia)   Atypical pneumonia   Interstitial lung disease (HCC)   Hypokalemia    ASSESSMENT AND PLAN:  Geoffrey Hynes is a 58 y.o. male  with a past medical history of hypertension, hyperlipidemia, GERD, tobacco use who presented to the ED on 03/07/2023 for chest pain.  Troponins found to be elevated.  Cardiology was consulted for further evaluation.   # NSTEMI # Hypertension # Hyperlipidemia Patient presented after recurrent episode of chest pain on exertion.  Had 1 episode on Friday of last  week and then again yesterday. Troponins trended 193 > 213 > 258 > 311 > 264 > 141.  EKG demonstrated sinus rhythm without acute ST-T changes. -Echo ordered -Continue aspirin 81 mg daily and Crestor 40 mg daily. -Continue metoprolol succinate 25 mg daily, lisinopril 20 mg daily, hydrochlorothiazide 12.5 mg daily.  Further adjustments to medications pending results of echocardiogram and heart catheterization. -Discussed the risks and benefits of proceeding with LHC for further evaluation with the patient.  He is agreeable to proceed.  NPO after midnight until [facility] tomorrow (03/09/2023) with Dr. Juliann Pares.  Written consent will be obtained.  Further recommendations following LHC.     TIMI Risk Score for Unstable Angina or Non-ST Elevation MI:   The patient's TIMI risk score is 5, which indicates a 26% risk of all cause mortality, new or recurrent myocardial infarction or  need for urgent revascularization in the next 14 days.   This patient's plan of care was discussed and created with Dr. Juliann Pares and he is in agreement.  Signed: Gale Journey, PA-C  03/08/2023, 1:14 PM [facility]

## 2023-03-08 NOTE — Assessment & Plan Note (Signed)
Upon discharge will give aspirin, Plavix, low-dose Toprol.  Increased dose of Crestor to 40 mg for LDL being 133.  Cardiac catheterization did not show any obstructive disease.  Medical management recommended.  Follow-up with cardiac rehab and cardiology as outpatient.  Out of work until seen by cardiology.Marland Kitchen

## 2023-03-08 NOTE — Consult Note (Addendum)
PHARMACY - ANTICOAGULATION CONSULT NOTE  Pharmacy Consult for heparin infusion Indication: chest pain/ACS  No Known Allergies  Patient Measurements: Height: 5\' 7"  (170.2 cm) Weight: 87.1 kg (192 lb) IBW/kg (Calculated) : 66.1 Heparin Dosing Weight: 84 kg  Vital Signs: Temp: 97.5 F (36.4 C) (02/13 1128) Temp Source: Oral (02/13 1128) BP: 102/60 (02/13 1600) Pulse Rate: 85 (02/13 1600)  Labs: Recent Labs    03/07/23 1709 03/07/23 1836 03/07/23 1844 03/07/23 1850 03/08/23 0104 03/08/23 0327 03/08/23 0539 03/08/23 0853 03/08/23 1623  HGB 15.9  --   --   --  14.3  --   --   --   --   HCT 47.9  --   --   --  41.7  --   --   --   --   PLT 349  --   --   --  322  --   --   --   --   APTT  --  30  --   --   --   --   --   --   --   LABPROT  --   --  13.3  --   --   --   --   --   --   INR  --   --  1.0  --   --   --   --   --   --   HEPARINUNFRC  --   --   --   --  0.12*  --   --  0.18* 0.36  CREATININE 0.93  --   --   --  0.83  --   --   --   --   TROPONINIHS 193*  --   --    < > 311* 264* 141*  --   --    < > = values in this interval not displayed.   Estimated Creatinine Clearance: 101 mL/min (by C-G formula based on SCr of 0.83 mg/dL).  Medical History: Past Medical History:  Diagnosis Date   GERD (gastroesophageal reflux disease)    HLD (hyperlipidemia)    Hypertension    Smoker    Medications:  No home anticoagulants per pharmacist review  Assessment: 58 yo male presented to the ED with primary complaint of chest pain. In ED patient found to have elevated troponin.  Pharmacy consulted to initiate heparin infusion.  Goal of Therapy:  Heparin level 0.3-0.7 units/ml Monitor platelets by anticoagulation protocol: Yes  02/13@0104 : HL 0.12, subtherapeutic@1100  units/hr 02/13@0853 : HL 0.18, subtherapeutic on 1350 u/hr 02/13@1623 : HL 0.36, therapeutic x1 on 1600 u/hr   Plan:  Continue heparin infusion at 1600 units/hr Check confirmatory anti-Xa level in 6  hours  Monitor daily heparin levels while on heparin Continue to monitor H&H and platelets  Thank you for involving pharmacy in this patient's care.   Rockwell Alexandria, PharmD Clinical Pharmacist 03/08/2023 5:01 PM

## 2023-03-08 NOTE — Progress Notes (Signed)
Progress Note   Patient: Gerald George ZOX:[id number] DOB: 06/28/1965 DOA: 03/07/2023     1 DOS: the patient was seen and examined on 03/08/2023   Brief hospital course: 58 y.o. male with medical history significant of smoker, HTN, GERD, obesity with BMI 30.07, who presents with chest pain.   Patient states that he had 1 episode of chest pain 5 days ago. 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  Today he had another episode of chest pain, which is is located in the front chest, pressure and burning-like pain, 10 out of 10 in severity, radiating to the bilateral axillary area, exertional.  It lasted for about 45 minutes and then resolved after having rest.  Associated with shortness of breath and diaphoresis.  Patient states that because of cough and SOB, his PCP did CT of chest on 02/19/23 which showed pneumonia and started him on prednisone and Levaquin.  He has 1 dose of Levaquin left.  Currently no fever or chills  CT scan of the chest showed mild patchy centrilobular groundglass micronodularity with mild patchy peripheral reticulation throughout both lungs which favor respiratory bronchiolitis-interstitial lung disease.  2/13.  Patient seen by cardiology and set up for cardiac catheterization.  Because of scheduling cardiac catheterization pushed off till tomorrow.  With LDL being 133 increase Crestor to 40 mg daily and will add low-dose beta-blocker.  Assessment and Plan: * NSTEMI (non-ST elevated myocardial infarction) (HCC) Continue aspirin, IV heparin.  Add low-dose beta-blocker.  Increase dose of Crestor to 40 mg for LDL being 133.  Cardiac catheterization tomorrow.  Interstitial lung disease (HCC) Possible atypical pneumonia.  Follow-up with pulmonary as outpatient.  Patient not wheezing today.  Decrease dose of Solu-Medrol.  Last dose of Levaquin course today.  GERD (gastroesophageal reflux disease) On Protonix  HLD  (hyperlipidemia) LDL 133.  Increase dose of Crestor to 40 mg daily.  Smoker Nicotine patch  Obesity (BMI 30-39.9) BMI 30.07  Hypokalemia Replace potassium.        Subjective: Patient still complaining of some chest pressure like a burning sensation.  He felt it in his arms and neck.  Had an episode on Friday and then spent most of Saturday in bed.  Had an episode yesterday at work and came in for further evaluation.  Also associated with shortness of breath and nausea but no vomiting.  Had some sweating.  Admitted for NSTEMI.  Physical Exam: Vitals:   03/08/23 0907 03/08/23 0907 03/08/23 0907 03/08/23 1012  BP: 121/87  121/87   Pulse:    (!) 118  Resp:    (!) 28  Temp:  (!) 97.5 F (36.4 C)    TempSrc:  Oral    SpO2:    92%  Weight:      Height:       Physical Exam HENT:     Head: Normocephalic.     Mouth/Throat:     Pharynx: No oropharyngeal exudate.  Eyes:     General: Lids are normal.     Conjunctiva/sclera: Conjunctivae normal.  Cardiovascular:     Rate and Rhythm: Normal rate and regular rhythm.     Heart sounds: Normal heart sounds, S1 normal and S2 normal.  Pulmonary:     Breath sounds: Examination of the right-lower field reveals decreased breath sounds. Examination of the left-lower field reveals decreased breath sounds. Decreased breath sounds present. No wheezing, rhonchi or rales.  Abdominal:  Palpations: Abdomen is soft.     Tenderness: There is no abdominal tenderness.  Musculoskeletal:     Right lower leg: No swelling.     Left lower leg: No swelling.  Skin:    General: Skin is warm.     Findings: No rash.  Neurological:     Mental Status: He is alert and oriented to person, place, and time.     Data Reviewed: CT scan of the chest reviewed, troponin peaked at 311, LDL 133, potassium 3.4, creatinine 0.83, white blood cell count 11.1, hemoglobin 14.3, platelet count 322.  Family Communication: updated  Disposition: Status is:  Inpatient Remains inpatient appropriate because: Cardiac cath got pushed till tomorrow.  Planned Discharge Destination: Home    Time spent: 28 minutes  Author: Alford Highland, MD 03/08/2023 11:20 AM  For on call review [email or web address].

## 2023-03-08 NOTE — ED Notes (Signed)
Lab called and said that sputum sample was insufficient, told lab we wold resend when we had a moment.

## 2023-03-08 NOTE — Assessment & Plan Note (Signed)
LDL 133.  Increased dose of Crestor to 40 mg daily.

## 2023-03-08 NOTE — Assessment & Plan Note (Addendum)
Possible atypical pneumonia.  Follow-up with pulmonary as outpatient.  Patient not wheezing today.  Finished up Levaquin here in the hospital.  Will go back on prednisone as prescribed as outpatient.

## 2023-03-08 NOTE — ED Notes (Signed)
Pt given incentive spirometer with teach back.

## 2023-03-08 NOTE — Consult Note (Signed)
PHARMACY - ANTICOAGULATION CONSULT NOTE  Pharmacy Consult for heparin infusion Indication: chest pain/ACS  No Known Allergies  Patient Measurements: Height: 5\' 7"  (170.2 cm) Weight: 87.1 kg (192 lb) IBW/kg (Calculated) : 66.1 Heparin Dosing Weight: 84 kg  Vital Signs: Temp: 98.2 F (36.8 C) (02/12 2209) Temp Source: Oral (02/12 2209) BP: 117/86 (02/12 2209) Pulse Rate: 80 (02/12 2209)  Labs: Recent Labs    03/07/23 1709 03/07/23 1836 03/07/23 1844 03/07/23 1850 03/07/23 2100 03/08/23 0104  HGB 15.9  --   --   --   --  14.3  HCT 47.9  --   --   --   --  41.7  PLT 349  --   --   --   --  322  APTT  --  30  --   --   --   --   LABPROT  --   --  13.3  --   --   --   INR  --   --  1.0  --   --   --   HEPARINUNFRC  --   --   --   --   --  0.12*  CREATININE 0.93  --   --   --   --   --   TROPONINIHS 193*  --   --  213* 258*  --     Estimated Creatinine Clearance: 90.1 mL/min (by C-G formula based on SCr of 0.93 mg/dL).   Medical History: Past Medical History:  Diagnosis Date   GERD (gastroesophageal reflux disease)    HLD (hyperlipidemia)    Hypertension    Smoker     Medications:  No home anticoagulants per pharmacist review  Assessment: 58 yo male presented to the ED with primary complaint of chest pain. In ED patient found to have elevated troponin.  Pharmacy consulted to initiate heparin infusion.  Goal of Therapy:  Heparin level 0.3-0.7 units/ml Monitor platelets by anticoagulation protocol: Yes  02/13@0104 : HL 0.12, subtherapeutic@1100  units/hr   Plan:  Give 2500 units bolus x 1 Increase heparin infusion at 1350 units/hr Check anti-Xa level in 6 hours after rate change and daily while on heparin Continue to monitor H&H and platelets  Bettey Costa, PharmD 03/08/2023,2:00 AM

## 2023-03-08 NOTE — Assessment & Plan Note (Signed)
Replaced

## 2023-03-08 NOTE — Assessment & Plan Note (Signed)
On Protonix

## 2023-03-08 NOTE — Assessment & Plan Note (Signed)
BMI 30.07

## 2023-03-08 NOTE — Hospital Course (Signed)
58 y.o. male with medical history significant of smoker, HTN, GERD, obesity with BMI 30.07, who presents with chest pain.   Patient states that he had 1 episode of chest pain 5 days ago. 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  Today he had another episode of chest pain, which is is located in the front chest, pressure and burning-like pain, 10 out of 10 in severity, radiating to the bilateral axillary area, exertional.  It lasted for about 45 minutes and then resolved after having rest.  Associated with shortness of breath and diaphoresis.  Patient states that because of cough and SOB, his PCP did CT of chest on 02/19/23 which showed pneumonia and started him on prednisone and Levaquin.  He has 1 dose of Levaquin left.  Currently no fever or chills  CT scan of the chest showed mild patchy centrilobular groundglass micronodularity with mild patchy peripheral reticulation throughout both lungs which favor respiratory bronchiolitis-interstitial lung disease.  2/13.  Patient seen by cardiology and set up for cardiac catheterization.  Because of scheduling cardiac catheterization pushed off till tomorrow.  With LDL being 133 increase Crestor to 40 mg daily and will add low-dose beta-blocker.

## 2023-03-08 NOTE — Consult Note (Signed)
PHARMACY - ANTICOAGULATION CONSULT NOTE  Pharmacy Consult for heparin infusion Indication: chest pain/ACS  No Known Allergies  Patient Measurements: Height: 5\' 7"  (170.2 cm) Weight: 87.1 kg (192 lb) IBW/kg (Calculated) : 66.1 Heparin Dosing Weight: 84 kg  Vital Signs: Temp: 97.5 F (36.4 C) (02/13 2305) Temp Source: Oral (02/13 1958) BP: 97/67 (02/13 2305) Pulse Rate: 85 (02/13 2305)  Labs: Recent Labs    03/07/23 1709 03/07/23 1836 03/07/23 1844 03/07/23 1850 03/08/23 0104 03/08/23 0327 03/08/23 0539 03/08/23 0853 03/08/23 1623  HGB 15.9  --   --   --  14.3  --   --   --   --   HCT 47.9  --   --   --  41.7  --   --   --   --   PLT 349  --   --   --  322  --   --   --   --   APTT  --  30  --   --   --   --   --   --   --   LABPROT  --   --  13.3  --   --   --   --   --   --   INR  --   --  1.0  --   --   --   --   --   --   HEPARINUNFRC  --   --   --   --  0.12*  --   --  0.18* 0.36  CREATININE 0.93  --   --   --  0.83  --   --   --   --   TROPONINIHS 193*  --   --    < > 311* 264* 141*  --   --    < > = values in this interval not displayed.   Estimated Creatinine Clearance: 101 mL/min (by C-G formula based on SCr of 0.83 mg/dL).  Medical History: Past Medical History:  Diagnosis Date   GERD (gastroesophageal reflux disease)    HLD (hyperlipidemia)    Hypertension    Smoker    Medications:  No home anticoagulants per pharmacist review  Assessment: 58 yo male presented to the ED with primary complaint of chest pain. In ED patient found to have elevated troponin.  Pharmacy consulted to initiate heparin infusion.  Goal of Therapy:  Heparin level 0.3-0.7 units/ml Monitor platelets by anticoagulation protocol: Yes  02/13@0104 : HL 0.12, subtherapeutic@1100  units/hr 02/13@0853 : HL 0.18, subtherapeutic on 1350 u/hr 02/13@1623 : HL 0.36, therapeutic x1 on 1600 u/hr 02/13@2350 : HL 0.82, SUPRAtherapeutic on 1600 u/hr   Plan:  Continue heparin infusion at 1450  units/hr Check anti-Xa level in 6 hours after rate change  Monitor daily heparin levels while on heparin Continue to monitor H&H and platelets  Thank you for involving pharmacy in this patient's care.   Bettey Costa, PharmD Clinical Pharmacist 03/09/2023 12:22 AM

## 2023-03-09 ENCOUNTER — Encounter
Admission: EM | Disposition: A | Discharge status: Home / Self Care | Payer: Self-pay | Source: Home / Self Care | Attending: Internal Medicine

## 2023-03-09 HISTORY — PX: LEFT HEART CATH AND CORONARY ANGIOGRAPHY: CATH118249

## 2023-03-09 LAB — CBC
HCT: 44.4 % (ref 39.0–52.0)
Hemoglobin: 14.9 g/dL (ref 13.0–17.0)
MCH: 28.7 pg (ref 26.0–34.0)
MCHC: 33.6 g/dL (ref 30.0–36.0)
MCV: 85.4 fL (ref 80.0–100.0)
Platelets: 327 10*3/uL (ref 150–400)
RBC: 5.2 MIL/uL (ref 4.22–5.81)
RDW: 12.8 % (ref 11.5–15.5)
WBC: 15.5 10*3/uL — ABNORMAL HIGH (ref 4.0–10.5)
nRBC: 0 % (ref 0.0–0.2)

## 2023-03-09 LAB — BASIC METABOLIC PANEL
Anion gap: 8 (ref 5–15)
BUN: 22 mg/dL — ABNORMAL HIGH (ref 6–20)
CO2: 23 mmol/L (ref 22–32)
Calcium: 9 mg/dL (ref 8.9–10.3)
Chloride: 106 mmol/L (ref 98–111)
Creatinine, Ser: 0.84 mg/dL (ref 0.61–1.24)
GFR, Estimated: >60 mL/min (ref >60)
Glucose, Bld: 100 mg/dL — ABNORMAL HIGH (ref 70–99)
Potassium: 3.8 mmol/L (ref 3.5–5.1)
Sodium: 137 mmol/L (ref 135–145)

## 2023-03-09 LAB — HEPARIN LEVEL (UNFRACTIONATED)
Heparin Unfractionated: 0.82 [IU]/mL — ABNORMAL HIGH (ref 0.30–0.70)
Heparin Unfractionated: <0.1 [IU]/mL — ABNORMAL LOW (ref 0.30–0.70)

## 2023-03-09 LAB — CARDIAC CATHETERIZATION: Cath EF Quantitative: 55 %

## 2023-03-09 SURGERY — LEFT HEART CATH AND CORONARY ANGIOGRAPHY
Anesthesia: Moderate Sedation

## 2023-03-09 MED ORDER — CLOPIDOGREL BISULFATE 75 MG PO TABS: 75.0000 mg | ORAL_TABLET | Freq: Every day | ORAL | Status: DC

## 2023-03-09 MED ORDER — VERAPAMIL HCL 2.5 MG/ML IV SOLN
INTRAVENOUS | Status: DC | PRN
  Administered 2023-03-09: 2.5 mg via INTRA_ARTERIAL

## 2023-03-09 MED ORDER — SODIUM CHLORIDE 0.9% FLUSH: 3.0000 mL | Freq: Two times a day (BID) | INTRAVENOUS | Status: DC

## 2023-03-09 MED ORDER — ROSUVASTATIN CALCIUM 40 MG PO TABS: 40.0000 mg | ORAL_TABLET | Freq: Every day | ORAL | 0 refills | Status: AC

## 2023-03-09 MED ORDER — ROSUVASTATIN CALCIUM 40 MG PO TABS: 40.0000 mg | ORAL_TABLET | Freq: Every day | ORAL | 0 refills | Status: DC

## 2023-03-09 MED ORDER — METOPROLOL SUCCINATE ER 25 MG PO TB24: 25.0000 mg | ORAL_TABLET | Freq: Every day | ORAL | 0 refills | Status: DC

## 2023-03-09 MED ORDER — PANTOPRAZOLE SODIUM 40 MG PO TBEC: 40.0000 mg | DELAYED_RELEASE_TABLET | Freq: Every day | ORAL | 0 refills | Status: DC

## 2023-03-09 MED ORDER — MIDAZOLAM HCL 2 MG/2ML IJ SOLN
INTRAMUSCULAR | Status: DC | PRN
  Administered 2023-03-09: 1 mg via INTRAVENOUS

## 2023-03-09 MED ORDER — METOPROLOL SUCCINATE ER 25 MG PO TB24: 25.0000 mg | ORAL_TABLET | Freq: Every day | ORAL | 0 refills | Status: AC

## 2023-03-09 MED ORDER — HEPARIN SODIUM (PORCINE) 1000 UNIT/ML IJ SOLN
INTRAMUSCULAR | Status: AC
Start: 2023-03-09 — End: ?
  Filled 2023-03-09: qty 10

## 2023-03-09 MED ORDER — SODIUM CHLORIDE 0.9 % WEIGHT BASED INFUSION: 1.0000 mL/kg/h | INTRAVENOUS | Status: DC

## 2023-03-09 MED ORDER — HEPARIN (PORCINE) IN NACL 1000-0.9 UT/500ML-% IV SOLN
INTRAVENOUS | Status: AC
  Filled 2023-03-09: qty 1000

## 2023-03-09 MED ORDER — PANTOPRAZOLE SODIUM 40 MG PO TBEC: 40.0000 mg | DELAYED_RELEASE_TABLET | Freq: Every day | ORAL | 0 refills | Status: AC

## 2023-03-09 MED ORDER — MIDAZOLAM HCL 2 MG/2ML IJ SOLN
INTRAMUSCULAR | Status: AC
  Filled 2023-03-09: qty 2

## 2023-03-09 MED ORDER — LIDOCAINE HCL (PF) 1 % IJ SOLN
INTRAMUSCULAR | Status: DC | PRN
  Administered 2023-03-09: 2 mL

## 2023-03-09 MED ORDER — CLOPIDOGREL BISULFATE 75 MG PO TABS: 75.0000 mg | ORAL_TABLET | Freq: Every day | ORAL | 0 refills | Status: DC

## 2023-03-09 MED ORDER — HEPARIN SODIUM (PORCINE) 1000 UNIT/ML IJ SOLN
INTRAMUSCULAR | Status: DC | PRN
  Administered 2023-03-09: 4000 [IU] via INTRAVENOUS

## 2023-03-09 MED ORDER — IOHEXOL 300 MG/ML  SOLN
INTRAMUSCULAR | Status: DC | PRN
  Administered 2023-03-09: 70 mL

## 2023-03-09 MED ORDER — VERAPAMIL HCL 2.5 MG/ML IV SOLN
INTRAVENOUS | Status: AC
  Filled 2023-03-09: qty 2

## 2023-03-09 MED ORDER — CLOPIDOGREL BISULFATE 75 MG PO TABS
75.0000 mg | ORAL_TABLET | Freq: Every day | ORAL | 0 refills | Status: AC
Start: 2023-03-10 — End: ?

## 2023-03-09 MED ORDER — FENTANYL CITRATE (PF) 100 MCG/2ML IJ SOLN
INTRAMUSCULAR | Status: DC | PRN
  Administered 2023-03-09: 25 ug via INTRAVENOUS

## 2023-03-09 MED ORDER — HEPARIN (PORCINE) IN NACL 1000-0.9 UT/500ML-% IV SOLN
INTRAVENOUS | Status: DC | PRN
  Administered 2023-03-09: 1000 mL

## 2023-03-09 MED ORDER — SODIUM CHLORIDE 0.9 % WEIGHT BASED INFUSION
3.0000 mL/kg/h | INTRAVENOUS | Status: AC
  Administered 2023-03-09: 3 mL/kg/h via INTRAVENOUS

## 2023-03-09 MED ORDER — SODIUM CHLORIDE 0.9 % IV SOLN: 250.0000 mL | INTRAVENOUS | Status: DC | PRN

## 2023-03-09 MED ORDER — SODIUM CHLORIDE 0.9% FLUSH: 3.0000 mL | INTRAVENOUS | Status: DC | PRN

## 2023-03-09 MED ORDER — ASPIRIN 81 MG PO CHEW: 81.0000 mg | CHEWABLE_TABLET | Freq: Every day | ORAL | Status: DC

## 2023-03-09 MED ORDER — HEPARIN BOLUS VIA INFUSION
2500.0000 [IU] | Freq: Once | INTRAVENOUS | Status: DC
  Filled 2023-03-09: qty 2500

## 2023-03-09 MED ORDER — FENTANYL CITRATE (PF) 100 MCG/2ML IJ SOLN
INTRAMUSCULAR | Status: AC
  Filled 2023-03-09: qty 2

## 2023-03-09 MED ORDER — ASPIRIN 81 MG PO CHEW
81.0000 mg | CHEWABLE_TABLET | ORAL | Status: AC
  Administered 2023-03-09: 81 mg via ORAL

## 2023-03-09 MED ORDER — ASPIRIN 81 MG PO CHEW
CHEWABLE_TABLET | ORAL | Status: AC
  Filled 2023-03-09: qty 1

## 2023-03-09 SURGICAL SUPPLY — 10 items
CATH 5FR JL3.5 JR4 ANG PIG MP (CATHETERS) IMPLANT
DEVICE RAD TR BAND REGULAR (VASCULAR PRODUCTS) IMPLANT
DRAPE BRACHIAL (DRAPES) IMPLANT
GLIDESHEATH SLEND SS 6F .021 (SHEATH) IMPLANT
GUIDEWIRE INQWIRE 1.5J.035X260 (WIRE) IMPLANT
INQWIRE 1.5J .035X260CM (WIRE) ×1 IMPLANT
PACK CARDIAC CATH (CUSTOM PROCEDURE TRAY) ×1 IMPLANT
PROTECTION STATION PRESSURIZED (MISCELLANEOUS) ×1 IMPLANT
SET ATX-X65L (MISCELLANEOUS) IMPLANT
STATION PROTECTION PRESSURIZED (MISCELLANEOUS) IMPLANT

## 2023-03-09 NOTE — Discharge Instructions (Signed)
Radial Site Care Refer to this sheet in the next few weeks. These instructions provide you with information about caring for yourself after your procedure. Your health care provider may also give you more specific instructions. Your treatment has been planned according to current medical practices, but problems sometimes occur. Call your health care provider if you have any problems or questions after your procedure. What can I expect after the procedure? After your procedure, it is typical to have the following: Bruising at the radial site that usually fades within 1-2 weeks. Blood collecting in the tissue (hematoma) that may be painful to the touch. It should usually decrease in size and tenderness within 1-2 weeks.  Follow these instructions at home: Take medicines only as directed by your health care provider. If you are on a medication called Metformin please do not take for 48 hours after your procedure. Over the next 48hrs please increase your fluid intake of water and non caffeine beverages to flush the contrast dye out of your system.  You may shower 24 hours after the procedure  Leave your bandage on and gently wash the site with plain soap and water. Pat the area dry with a clean towel. Do not rub the site, because this may cause bleeding.  Remove your dressing 48hrs after your procedure and leave open to air.  Do not submerge your site in water for 7 days. This includes swimming and washing dishes.  Check your insertion site every day for redness, swelling, or drainage. Do not apply powder or lotion to the site. Do not flex or bend the affected arm for 24 hours or as directed by your health care provider. Do not push or pull heavy objects with the affected arm for 24 hours or as directed by your health care provider. Do not lift over 10 lb (4.5 kg) for 5 days after your procedure or as directed by your health care provider. Ask your health care provider when it is okay to: Return to  work or school. Resume usual physical activities or sports. Resume sexual activity. Do not drive home if you are discharged the same day as the procedure. Have someone else drive you. You may drive 48 hours after the procedure Do not operate machinery or power tools for 24 hours after the procedure. If your procedure was done as an outpatient procedure, which means that you went home the same day as your procedure, a responsible adult should be with you for the first 24 hours after you arrive home. Keep all follow-up visits as directed by your health care provider. This is important. Contact a health care provider if: You have a fever. You have chills. You have increased bleeding from the radial site. Hold pressure on the site. Get help right away if: You have unusual pain at the radial site. You have redness, warmth, or swelling at the radial site. You have drainage (other than a small amount of blood on the dressing) from the radial site. The radial site is bleeding, and the bleeding does not stop after 15 minutes of holding steady pressure on the site. Your arm or hand becomes pale, cool, tingly, or numb. This information is not intended to replace advice given to you by your health care provider. Make sure you discuss any questions you have with your health care provider. Document Released: 02/11/2010 Document Revised: 06/17/2015 Document Reviewed: 07/28/2013 Elsevier Interactive Patient Education  2018 [employer].

## 2023-03-09 NOTE — Progress Notes (Signed)
Patient ID: Gerald George, male   DOB: March 21, 1965, 58 y.o.   MRN: [id number] [facility]    SUBJECTIVE: Sinus postcardiac cath no significant obstructive disease no further chest pain resting comfortably feels well enough to go home.  No access site issues   Vitals:   03/09/23 1330 03/09/23 1346 03/09/23 1400 03/09/23 1415  BP: 125/84 102/80 107/75 110/72  Pulse: 83 82 83 85  Resp: 19 (!) 25 16 (!) 28  Temp:      TempSrc:      SpO2: 93% 92% 92% 90%  Weight:      Height:         Intake/Output Summary (Last 24 hours) at 03/09/2023 1436 Last data filed at 03/08/2023 1852 Gross per 24 hour  Intake 180.11 ml  Output --  Net 180.11 ml      PHYSICAL EXAM  General: Well developed, well nourished, in no acute distress HEENT:  Normocephalic and atramatic Neck:  No JVD.  Lungs: Clear bilaterally to auscultation and percussion. Heart: HRRR . Normal S1 and S2 without gallops or murmurs.  Abdomen: Bowel sounds are positive, abdomen soft and non-tender  Msk:  Back normal, normal gait. Normal strength and tone for age. Extremities: No clubbing, cyanosis or edema.   Neuro: Alert and oriented X 3. Psych:  Good affect, responds appropriately   LABS: Basic Metabolic Panel: Recent Labs    03/08/23 0104 03/09/23 0739  NA 135 137  K 3.4* 3.8  CL 102 106  CO2 23 23  GLUCOSE 98 100*  BUN 17 22*  CREATININE 0.83 0.84  CALCIUM 8.6* 9.0   Liver Function Tests: No results for input(s): "AST", "ALT", "ALKPHOS", "BILITOT", "PROT", "ALBUMIN" in the last 72 hours. No results for input(s): "LIPASE", "AMYLASE" in the last 72 hours. CBC: Recent Labs    03/08/23 0104 03/09/23 0739  WBC 11.1* 15.5*  HGB 14.3 14.9  HCT 41.7 44.4  MCV 83.4 85.4  PLT 322 327   Cardiac Enzymes: No results for input(s): "CKTOTAL", "CKMB", "CKMBINDEX", "TROPONINI" in the last 72 hours. BNP: Invalid input(s): "POCBNP" D-Dimer: No results for input(s): "DDIMER" in the last 72 hours. Hemoglobin  A1C: Recent Labs    03/07/23 1709  HGBA1C 5.6   Fasting Lipid Panel: Recent Labs    03/08/23 0327  CHOL 199  HDL 35*  LDLCALC 133*  TRIG 157*  CHOLHDL 5.7   Thyroid Function Tests: No results for input(s): "TSH", "T4TOTAL", "T3FREE", "THYROIDAB" in the last 72 hours.  Invalid input(s): "FREET3" Anemia Panel: No results for input(s): "VITAMINB12", "FOLATE", "FERRITIN", "TIBC", "IRON", "RETICCTPCT" in the last 72 hours.  DG Chest 2 View Result Date: 03/07/2023 CLINICAL DATA:  Chest pain. EXAM: CHEST - 2 VIEW COMPARISON:  Chest radiograph dated 02/22/2017. FINDINGS: No focal consolidation, pleural effusion, or pneumothorax. The cardiac silhouette is within normal limits. No acute osseous pathology. IMPRESSION: No active cardiopulmonary disease. Electronically Signed   By: Elgie Collard M.D.   On: 03/07/2023 18:51     Echo normal left ventricular function EF of at least 55%  TELEMETRY: Normal sinus rhythm rate of 75 nonspecific ST-T changes:  ASSESSMENT AND PLAN:  Principal Problem:   NSTEMI (non-ST elevated myocardial infarction) (HCC) Active Problems:   Smoker   GERD (gastroesophageal reflux disease)   Obesity (BMI 30-39.9)   HLD (hyperlipidemia)   Atypical pneumonia   Interstitial lung disease (HCC)   Hypokalemia Status post cardiac cath  Plan Left heart cath today showed no significant obstructive disease normal left  ventricular function will recommend conservative medical therapy aspirin Plavix low-dose beta-blocker possibly ARB or ACE and statin.  Plavix can be discontinued after 6 months Possible non-STEMI with elevated troponins but no obstructive disease we will still treat with aspirin Plavix beta-blocker ARB and statin COPD bronchitis recommend the patient follow-up with pulmonary for further evaluation History of smoking advised patient refrain from tobacco abuse Consider reflux therapy with omeprazole or Protonix Elevated troponin could be demand  ischemia spasm small vessel disease but no evidence of any myocardial damage Patient was referred to cardiac rehab nonetheless Patient should be safe for discharge later today   Gerald Pea, MD 03/09/2023 2:36 PM

## 2023-03-09 NOTE — Consult Note (Signed)
PHARMACY - ANTICOAGULATION CONSULT NOTE  Pharmacy Consult for heparin infusion Indication: chest pain/ACS  No Known Allergies  Patient Measurements: Height: 5\' 7"  (170.2 cm) Weight: 87.1 kg (192 lb) IBW/kg (Calculated) : 66.1 Heparin Dosing Weight: 84 kg  Vital Signs: Temp: 97.7 F (36.5 C) (02/14 0724) Temp Source: Oral (02/14 0724) BP: 111/76 (02/14 0724) Pulse Rate: 75 (02/14 0724)  Labs: Recent Labs    03/07/23 1709 03/07/23 1836 03/07/23 1844 03/07/23 1850 03/08/23 0104 03/08/23 0327 03/08/23 0539 03/08/23 0853 03/08/23 1623 03/08/23 2350 03/09/23 0739  HGB 15.9  --   --   --  14.3  --   --   --   --   --  14.9  HCT 47.9  --   --   --  41.7  --   --   --   --   --  44.4  PLT 349  --   --   --  322  --   --   --   --   --  327  APTT  --  30  --   --   --   --   --   --   --   --   --   LABPROT  --   --  13.3  --   --   --   --   --   --   --   --   INR  --   --  1.0  --   --   --   --   --   --   --   --   HEPARINUNFRC  --   --   --   --  0.12*  --   --    < > 0.36 0.82* <0.10*  CREATININE 0.93  --   --   --  0.83  --   --   --   --   --  0.84  TROPONINIHS 193*  --   --    < > 311* 264* 141*  --   --   --   --    < > = values in this interval not displayed.   Estimated Creatinine Clearance: 99.8 mL/min (by C-G formula based on SCr of 0.84 mg/dL).  Medical History: Past Medical History:  Diagnosis Date   GERD (gastroesophageal reflux disease)    HLD (hyperlipidemia)    Hypertension    Smoker    Medications:  No home anticoagulants per pharmacist review  Assessment: 58 yo male presented to the ED with primary complaint of chest pain. In ED patient found to have elevated troponin.  Pharmacy consulted to initiate heparin infusion.  Goal of Therapy:  Heparin level 0.3-0.7 units/ml Monitor platelets by anticoagulation protocol: Yes  02/13@0104 : HL 0.12, subtherapeutic@1100  units/hr 02/13@0853 : HL 0.18, subtherapeutic on 1350 u/hr 02/13@1623 : HL 0.36,  therapeutic x1 on 1600 u/hr 02/13@2350 : HL 0.82, SUPRAtherapeutic on 1600 u/hr 02/14@0739 : HL <0.1, Subtherapeutic on 1450 units/hr--> spoke with nurse to ensure this level was accurate given previous two levels were therapeutic and supra-therapeutic; per nurse this was a lab draw from right hand and heparin is infusion in left FA. She also said there was nothing wrong with line and heparin has been infusing appropriately.    Plan:  Give 2500 unit bolus Increase heparin infusion to 1650 units/hr Check anti-Xa level in 6 hours after rate change  Monitor daily heparin levels while on heparin Continue to monitor H&H and platelets  Thank  you for involving pharmacy in this patient's care.   Merryl Hacker, PharmD Clinical Pharmacist 03/09/2023 8:41 AM

## 2023-03-09 NOTE — Discharge Summary (Signed)
Physician Discharge Summary   Patient: Gerald George MRN: [id number] DOB: 25-Jul-1965  Admit date:     03/07/2023  Discharge date: 03/09/23  Discharge Physician: Alford Highland   PCP: Danella Penton, MD   Recommendations at discharge:   Follow-up PCP 5 days Follow-up cardiology Follow-up cardiac rehab Keep appointment with pulmonologist  Discharge Diagnoses: Principal Problem:   NSTEMI (non-ST elevated myocardial infarction) (HCC) Active Problems:   Interstitial lung disease (HCC)   GERD (gastroesophageal reflux disease)   HLD (hyperlipidemia)   Smoker   Atypical pneumonia   Obesity (BMI 30-39.9)   Hypokalemia    Hospital Course: 58 y.o. male with medical history significant of smoker, HTN, GERD, obesity with BMI 30.07, who presents with chest pain.   Patient states that he had 1 episode of chest pain 5 days ago. 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  Today he had another episode of chest pain, which is is located in the front chest, pressure and burning-like pain, 10 out of 10 in severity, radiating to the bilateral axillary area, exertional.  It lasted for about 45 minutes and then resolved after having rest.  Associated with shortness of breath and diaphoresis.  Patient states that because of cough and SOB, his PCP did CT of chest on 02/19/23 which showed pneumonia and started him on prednisone and Levaquin.  He has 1 dose of Levaquin left.  Currently no fever or chills  02/19/23: CT scan of the chest showed mild patchy centrilobular groundglass micronodularity with mild patchy peripheral reticulation throughout both lungs which favor respiratory bronchiolitis-interstitial lung disease.  2/13.  Patient seen by cardiology and set up for cardiac catheterization.  Because of scheduling cardiac catheterization pushed off till tomorrow.  With LDL being 133 increase Crestor to 40 mg daily and will add low-dose  beta-blocker. 2/14.  Patient had a cardiac cath did not show any significant obstructive disease and recommended medical management with aspirin, Plavix (for 6 months), increased dose of Crestor and low-dose beta-blocker.  Assessment and Plan: * NSTEMI (non-ST elevated myocardial infarction) (HCC) Upon discharge will give aspirin, Plavix, low-dose Toprol.  Increased dose of Crestor to 40 mg for LDL being 133.  Cardiac catheterization did not show any obstructive disease.  Medical management recommended.  Follow-up with cardiac rehab and cardiology as outpatient.  Out of work until seen by cardiology..  Interstitial lung disease (HCC) Possible atypical pneumonia.  Follow-up with pulmonary as outpatient.  Patient not wheezing today.  Finished up Levaquin here in the hospital.  Will go back on prednisone as prescribed as outpatient.  GERD (gastroesophageal reflux disease) Switch omeprazole over to Protonix because omeprazole interacts with Plavix.  HLD (hyperlipidemia) LDL 133.  Increased dose of Crestor to 40 mg daily.  Smoker Nicotine patch  Obesity (BMI 30-39.9) BMI 30.07  Hypokalemia Replaced         Consultants: Cardiology Procedures performed: Cardiac catheterization Disposition: Home Diet recommendation:  Cardiac diet DISCHARGE MEDICATION: Allergies as of 03/09/2023   No Known Allergies      Medication List     STOP taking these medications    amitriptyline 10 MG tablet Commonly known as: ELAVIL   levofloxacin 500 MG tablet Commonly known as: LEVAQUIN   omeprazole 40 MG capsule Commonly known as: PRILOSEC       TAKE these medications    albuterol 108 (90 Base) MCG/ACT inhaler Commonly known as: VENTOLIN HFA Inhale 2 puffs  into the lungs every 6 (six) hours as needed for wheezing or shortness of breath.   aspirin EC 81 MG tablet Take 81 mg by mouth daily. Swallow whole.   clopidogrel 75 MG tablet Commonly known as: PLAVIX Take 1 tablet (75 mg  total) by mouth daily with breakfast. Start taking on: March 10, 2023   cyanocobalamin 1000 MCG tablet Commonly known as: VITAMIN B12 Take 1,000 mcg by mouth daily.   lisinopril-hydrochlorothiazide 20-12.5 MG tablet Commonly known as: ZESTORETIC Take 1 tablet by mouth daily.   metoprolol succinate 25 MG 24 hr tablet Commonly known as: TOPROL-XL Take 1 tablet (25 mg total) by mouth daily.   pantoprazole 40 MG tablet Commonly known as: Protonix Take 1 tablet (40 mg total) by mouth daily.   predniSONE 10 MG tablet Commonly known as: DELTASONE Take 10 mg by mouth daily with breakfast.   rosuvastatin 40 MG tablet Commonly known as: CRESTOR Take 1 tablet (40 mg total) by mouth daily. What changed:  medication strength how much to take        Follow-up Information     Alluri, Meryl Dare, MD. Go to.   Specialty: Cardiology Why: follow up appointment 03/19/2023 at 3:15pm. Please arrive 15 minutes early. Contact information: [address] [place] [address] [phone]         Danella Penton, MD Follow up in 5 day(s).   Specialty: Internal Medicine Contact information: [address] [facility] [address] [facility] [place] [facility] [place] [address] [phone]                Discharge Exam: Filed Weights   03/07/23 1707  Weight: 87.1 kg   Physical Exam HENT:     Head: Normocephalic.     Mouth/Throat:     Pharynx: No oropharyngeal exudate.  Eyes:     General: Lids are normal.     Conjunctiva/sclera: Conjunctivae normal.  Cardiovascular:     Rate and Rhythm: Normal rate and regular rhythm.     Heart sounds: Normal heart sounds, S1 normal and S2 normal.  Pulmonary:     Breath sounds: Examination of the right-lower field reveals decreased breath sounds. Examination of the left-lower field reveals decreased breath sounds. Decreased breath sounds present. No wheezing, rhonchi or rales.  Abdominal:     Palpations: Abdomen is soft.      Tenderness: There is no abdominal tenderness.  Musculoskeletal:     Right lower leg: No swelling.     Left lower leg: No swelling.  Skin:    General: Skin is warm.     Findings: No rash.  Neurological:     Mental Status: He is alert and oriented to person, place, and time.      Condition at discharge: stable  The results of significant diagnostics from this hospitalization (including imaging, microbiology, ancillary and laboratory) are listed below for reference.   Imaging Studies: DG Chest 2 View Result Date: 03/07/2023 CLINICAL DATA:  Chest pain. EXAM: CHEST - 2 VIEW COMPARISON:  Chest radiograph dated 02/22/2017. FINDINGS: No focal consolidation, pleural effusion, or pneumothorax. The cardiac silhouette is within normal limits. No acute osseous pathology. IMPRESSION: No active cardiopulmonary disease. Electronically Signed   By: Elgie Collard M.D.   On: 03/07/2023 18:51   CT CHEST WO CONTRAST Result Date: 02/27/2023 CLINICAL DATA:  Unexplained weight loss. Dyspnea on exertion. Current smoker. EXAM: CT CHEST WITHOUT CONTRAST TECHNIQUE: Multidetector CT imaging of the chest was performed following the standard protocol without IV contrast.  RADIATION DOSE REDUCTION: This exam was performed according to the departmental dose-optimization program which includes automated exposure control, adjustment of the mA and/or kV according to patient size and/or use of iterative reconstruction technique. COMPARISON:  Oct 14, 2015 chest CT angiogram. 02/22/2017 chest radiograph. FINDINGS: Cardiovascular: Normal heart size. No significant pericardial effusion/thickening. Three-vessel coronary atherosclerosis. Atherosclerotic nonaneurysmal thoracic aorta. Normal caliber pulmonary arteries. Mediastinum/Nodes: No significant thyroid nodules. Unremarkable esophagus. No axillary adenopathy. No pathologically enlarged mediastinal or discrete hilar nodes on these noncontrast images. Lungs/Pleura: No pneumothorax. No  pleural effusion. Mild centrilobular emphysema with diffuse bronchial wall thickening. No acute consolidative airspace disease, lung masses or significant pulmonary nodules. Mild patchy centrilobular ground-glass micronodularity with mild patchy peripheral reticulation throughout both lungs without a clear apicobasilar gradient. No significant regions of traction bronchiectasis, architectural distortion or frank honeycombing. Findings are mildly more pronounced than 2015-10-14 chest CT. Upper abdomen: No acute abnormality. Musculoskeletal: No aggressive appearing focal osseous lesions. Minimal thoracic spondylosis. IMPRESSION: 1. Mild patchy centrilobular ground-glass micronodularity with mild patchy peripheral reticulation throughout both lungs without a clear apicobasilar gradient. No bronchiectasis or honeycombing. Findings are mildly more pronounced than 14-Oct-2015 chest CT. Findings are nonspecific, favoring respiratory bronchiolitis-interstitial lung disease (RB-ILD). Other etiologies including early UIP or NSIP are less favored but not entirely excluded. Follow-up high-resolution chest CT may be considered in 6-12 months as clinically warranted. 2. Three-vessel coronary atherosclerosis. 3. Aortic Atherosclerosis (ICD10-I70.0) and Emphysema (ICD10-J43.9). Electronically Signed   By: Delbert Phenix M.D.   On: 02/27/2023 00:23    Microbiology: Results for orders placed or performed during the hospital encounter of 03/07/23  Expectorated Sputum Assessment w Gram Stain, Rflx to Resp Cult     Status: None   Collection Time: 03/08/23  8:53 AM   Specimen: Sputum  Result Value Ref Range Status   Specimen Description SPUTUM  Final   Special Requests NONE  Final   Sputum evaluation   Final    Sputum specimen not acceptable for testing.  Please recollect.   RESULT CALLED TO, READ BACK BY AND VERIFIED WITH: Suzy Bouchard 03/08/2023 LRL Performed at [facility], [address] [place], [place]  [address]    Report Status 03/08/2023 FINAL  Final    Labs: CBC: Recent Labs  Lab 03/07/23 1709 03/08/23 0104 03/09/23 0739  WBC 10.3 11.1* 15.5*  HGB 15.9 14.3 14.9  HCT 47.9 41.7 44.4  MCV 86.5 83.4 85.4  PLT 349 322 327   Basic Metabolic Panel: Recent Labs  Lab 03/07/23 1709 03/08/23 0104 03/09/23 0739  NA 133* 135 137  K 3.8 3.4* 3.8  CL 98 102 106  CO2 22 23 23   GLUCOSE 108* 98 100*  BUN 17 17 22*  CREATININE 0.93 0.83 0.84  CALCIUM 9.3 8.6* 9.0   Liver Function Tests: No results for input(s): "AST", "ALT", "ALKPHOS", "BILITOT", "PROT", "ALBUMIN" in the last 168 hours. CBG: No results for input(s): "GLUCAP" in the last 168 hours.  Discharge time spent: greater than 30 minutes.  Signed: Alford Highland, MD [facility] 03/09/2023

## 2023-03-09 NOTE — CV Procedure (Signed)
Brief post cath note Indication elevated troponin possible non-STEMI  Right radial approach  Left ventriculogram showed normal left ventricular function EF of at least 55%  Coronaries Left main large free of disease  LAD large with only minor irregularities Circumflex large minor irregularities 50% distal circumflex RCA large minor irregularities Right dominant  No significant obstructive disease Recommend conservative medical therapy  Consider aspirin 81 mg a day and Plavix 75 mg a day for 6 months  Statin therapy moderate to high-dose  Low-dose beta-blocker ARB prior to discharge  Patient referred to cardiac rehab  Okay for discharge later today  Follow-up with cardiology 1 to 2 weeks

## 2023-03-12 ENCOUNTER — Encounter: Payer: Self-pay | Admitting: Internal Medicine

## 2023-03-19 NOTE — Progress Notes (Signed)
 Established Patient Visit   Chief Complaint: [facility] discharge follow-up, CAD with recent heart cath Date of Service: 03/19/2023 Date of Birth: April 25, 1965 PCP: Gerald Oneil Novel, MD  History of Present Illness: Gerald George is a 58 y.o.male patient who presented for posthospital discharge follow-up, CAD with NSTEMI with recent heart cath.  Patient presented to hospital with chest pressure/chest pain and noted to have mild NSTEMI.  Underwent left heart catheterization which showed preserved LVEF of 55% on ventriculogram, minor luminal irregularities with distal left circumflex 50% stenosis.  Discharged home on aspirin  and Plavix  for 6 months.  Other history include history of TIA, smoking which she quit during recent hospitalization, hypertension, COPD/emphysema.  Today patient states that he is doing well.  No complaints of recurrent chest pain/pressure.  Has some exertional dyspnea which she relates to emphysema, symptoms are stable.  No palpitation, dizziness, syncope.  Past Medical and Surgical History  Past Medical History Past Medical History:  Diagnosis Date  . Benign essential hypertension 11/08/2017  . Esophagitis, reflux 11/01/2015  . H. pylori infection 11/2015   Treated with Pylera + Prilosec  . 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 OSA on CPAP 11/01/2015    Past Surgical History He has a past surgical history that includes Colonoscopy (01/06/2016) and egd (01/06/2016).   Medications and Allergies  Current Medications  Current Outpatient Medications  Medication Sig Dispense Refill  . aspirin  81 MG chewable tablet     . clopidogreL  (PLAVIX ) 75 mg tablet Take 75 mg by mouth daily with breakfast    . cyanocobalamin  (VITAMIN B12) 1000 MCG tablet Take 1,000 mcg by mouth once daily    .  lisinopriL -hydroCHLOROthiazide  (ZESTORETIC ) 20-12.5 mg tablet Take 1 tablet by mouth once daily 100 tablet 3  . metoprolol  SUCCinate (TOPROL -XL) 25 MG XL tablet Take 1 tablet by mouth once daily    . omeprazole (PRILOSEC) 40 MG DR capsule Take 1 capsule (40 mg total) by mouth once daily 100 capsule 3  . pantoprazole  (PROTONIX ) 40 MG DR tablet Take 40 mg by mouth once daily    . albuterol  90 mcg/actuation inhaler Inhale 2 inhalations into the lungs every 6 (six) hours as needed for Wheezing 1 Inhaler 11  . amitriptyline (ELAVIL) 10 MG tablet Take 1 tablet (10 mg total) by mouth at bedtime (Patient not taking: Reported on 03/19/2023) 90 tablet 3  . loratadine (CLARITIN) 10 mg tablet Take 10 mg by mouth once daily    (Patient not taking: Reported on 03/19/2023)    . rosuvastatin  (CRESTOR ) 40 MG tablet Take 1 tablet (40 mg total) by mouth once daily 30 tablet 11   No current facility-administered medications for this visit.    Allergies: Patient has no known allergies.  Social and Family History  Social History  reports that he quit smoking 12 days ago. His smoking use included cigarettes. He has never used smokeless tobacco. He reports that he does not currently use alcohol. He reports that he does not use drugs.  Family History Family History  Problem Relation Name Age of Onset  . Colon polyps Neg Hx    . Colon cancer Neg Hx      Review of Systems   Review of Systems:  Chronic stable exertional dyspnea No chest pain/pressure  Physical Examination   Vitals:BP 120/68   Pulse 84  Ht 172.7 cm (5' 8)   Wt 87.5 kg (193 lb)   SpO2 96%   BMI 29.35 kg/m  Ht:172.7 cm (5' 8) Wt:87.5 kg (193 lb) ADJ:Anib surface area is 2.05 meters squared. Body mass index is 29.35 kg/m.  HEENT: Pupils equally reactive to light and accomodation  Neck: Supple, no significant JVD Lungs: clear to auscultation bilaterally; no wheezes, rales, rhonchi Heart: Regular rate and rhythm. No  murmur Extremities: no pedal edema  Assessment and Plan   58 y.o. 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  Euvolemic on exam. Continue aspirin , Plavix .  Will stop Plavix  during next follow-up in 6 months. Continue metoprolol . Blood pressure well-controlled, continue lisinopril /hydrochlorothiazide . Continue high-dose rosuvastatin . Congratulated patient on smoking cessation. Echo before next follow-up. Heart healthy diet and regular activity  Orders Placed This Encounter  Procedures  . Echo complete    Return in about 6 months (around 09/16/2023).  KRISHNA CHAITANYA ALLURI, MD  This dictation was prepared with dragon dictation. Any transcription errors that result from this process are unintentional.

## 2023-08-07 ENCOUNTER — Emergency Department
Admission: EM | Admit: 2023-08-07 | Discharge: 2023-08-07 | Disposition: A | Discharge status: Home / Self Care | Payer: Self-pay

## 2023-08-07 ENCOUNTER — Other Ambulatory Visit: Payer: Self-pay

## 2023-08-07 ENCOUNTER — Emergency Department: Payer: Self-pay

## 2023-08-07 DIAGNOSIS — J9801 Acute bronchospasm: Secondary | ICD-10-CM | POA: Insufficient documentation

## 2023-08-07 DIAGNOSIS — T675XXA Heat exhaustion, unspecified, initial encounter: Secondary | ICD-10-CM | POA: Insufficient documentation

## 2023-08-07 DIAGNOSIS — I10 Essential (primary) hypertension: Secondary | ICD-10-CM | POA: Insufficient documentation

## 2023-08-07 DIAGNOSIS — R079 Chest pain, unspecified: Secondary | ICD-10-CM

## 2023-08-07 DIAGNOSIS — Z87891 Personal history of nicotine dependence: Secondary | ICD-10-CM | POA: Insufficient documentation

## 2023-08-07 LAB — BASIC METABOLIC PANEL WITH GFR
Anion gap: 10 (ref 5–15)
BUN: 14 mg/dL (ref 6–20)
CO2: 22 mmol/L (ref 22–32)
Calcium: 9.2 mg/dL (ref 8.9–10.3)
Chloride: 104 mmol/L (ref 98–111)
Creatinine, Ser: 0.73 mg/dL (ref 0.61–1.24)
GFR, Estimated: >60 mL/min (ref >60)
Glucose, Bld: 111 mg/dL — ABNORMAL HIGH (ref 70–99)
Potassium: 3.9 mmol/L (ref 3.5–5.1)
Sodium: 136 mmol/L (ref 135–145)

## 2023-08-07 LAB — CBC
HCT: 45.5 % (ref 39.0–52.0)
Hemoglobin: 15.6 g/dL (ref 13.0–17.0)
MCH: 29.1 pg (ref 26.0–34.0)
MCHC: 34.3 g/dL (ref 30.0–36.0)
MCV: 84.9 fL (ref 80.0–100.0)
Platelets: 344 K/uL (ref 150–400)
RBC: 5.36 MIL/uL (ref 4.22–5.81)
RDW: 12.6 % (ref 11.5–15.5)
WBC: 10.8 K/uL — ABNORMAL HIGH (ref 4.0–10.5)
nRBC: 0 % (ref 0.0–0.2)

## 2023-08-07 LAB — TROPONIN I (HIGH SENSITIVITY)
Troponin I (High Sensitivity): 4 ng/L (ref <18)
Troponin I (High Sensitivity): 3 ng/L (ref <18)

## 2023-08-07 LAB — LIPASE, BLOOD: Lipase: 61 U/L — ABNORMAL HIGH (ref 11–51)

## 2023-08-07 MED ORDER — ALBUTEROL SULFATE HFA 108 (90 BASE) MCG/ACT IN AERS: 2.0000 | INHALATION_SPRAY | Freq: Four times a day (QID) | RESPIRATORY_TRACT | 1 refills | Status: DC | PRN

## 2023-08-07 MED ORDER — ALBUTEROL SULFATE HFA 108 (90 BASE) MCG/ACT IN AERS: 2.0000 | INHALATION_SPRAY | Freq: Four times a day (QID) | RESPIRATORY_TRACT | 1 refills | Status: AC | PRN

## 2023-08-07 MED ORDER — SODIUM CHLORIDE 0.9 % IV BOLUS
1000.0000 mL | Freq: Once | INTRAVENOUS | Status: AC
  Administered 2023-08-07: 1000 mL via INTRAVENOUS

## 2023-08-07 MED ORDER — IPRATROPIUM-ALBUTEROL 0.5-2.5 (3) MG/3ML IN SOLN
3.0000 mL | Freq: Once | RESPIRATORY_TRACT | Status: AC
  Administered 2023-08-07: 3 mL via RESPIRATORY_TRACT
  Filled 2023-08-07: qty 3

## 2023-08-07 NOTE — ED Provider Notes (Signed)
 [facility] Provider Note    Event Date/Time   First MD Initiated Contact with Patient 08/07/23 1123     (approximate)   History   Chest Pain   HPI  Gerald George is a 58 y.o. 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  Patient states that he has had 3 weeks of chest pain and shortness of breath whenever he exerts himself.  He denies any chest pain currently while sitting in the room.  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  He reports compliance of all of his medications and follows up with his cardiologist and primary care physician.  He does continue to smoke and his last albuterol  was over 4 days ago.  He was recently admitted to our facility on 03/09/2023 for an NSTEMI during which she underwent a cardiac catheterization which demonstrated no coronary artery disease.  He denies any fevers chills cough congestion or abdominal pain      Physical Exam   Triage Vital Signs: ED Triage Vitals  Encounter Vitals Group     BP 08/07/23 0955 (!) 116/92     Girls Systolic BP Percentile --      Girls Diastolic BP Percentile --      Boys Systolic BP Percentile --      Boys Diastolic BP Percentile --      Pulse Rate 08/07/23 0955 88     Resp 08/07/23 0955 18     Temp 08/07/23 0955 98.2 F (36.8 C)     Temp Source 08/07/23 0955 Oral     SpO2 08/07/23 0955 95 %     Weight 08/07/23 1130 193 lb (87.5 kg)     Height 08/07/23 1130 5' 7 (1.702 m)     Head Circumference --      Peak Flow --      Pain Score 08/07/23 0953 3     Pain Loc --      Pain Education --      Exclude from Growth Chart --     Most recent vital signs: Vitals:   08/07/23 1230 08/07/23 1325  BP: 107/81 119/86  Pulse: 74 78  Resp: (!) 21 18  Temp:  97.9 F (36.6 C)  SpO2: 95% 94%   Nursing Triage Note reviewed. Vital  signs reviewed and patients oxygen saturation is normoxic  General: Patient is well nourished, well developed, awake and alert, resting comfortably in no acute distress Head: Normocephalic and atraumatic Eyes: Normal inspection, extraocular muscles intact, no conjunctival pallor Ear, nose, throat: Normal external exam Neck: Normal range of motion Respiratory: Patient is in no respiratory distress, lungs with wheezes throughout Cardiovascular: Patient is not tachycardic, RRR without murmur appreciated GI: Abd SNT with no guarding or rebound  Back: Normal inspection of the back with good strength and range of motion throughout all ext Extremities: pulses intact with good cap refills, no LE pitting edema or calf tenderness Neuro: The patient is alert and oriented to person, place, and time, appropriately conversive, with 5/5 bilat UE/LE strength, no gross motor or sensory defects noted. Coordination appears to be adequate. Skin: Warm, dry, and intact Psych: normal mood and affect, no SI or HI    ED Results / Procedures / Treatments   Labs (all labs ordered are listed, but only abnormal results are displayed)  Labs Reviewed  BASIC METABOLIC PANEL WITH GFR - Abnormal; Notable for the following components:      Result Value   Glucose, Bld 111 (*)    All other components within normal limits  CBC - Abnormal; Notable for the following components:   WBC 10.8 (*)    All other components within normal limits  LIPASE, BLOOD - Abnormal; Notable for the following components:   Lipase 61 (*)    All other components within normal limits  TROPONIN I (HIGH SENSITIVITY)  TROPONIN I (HIGH SENSITIVITY)     EKG  EKG and rhythm strip are interpreted by myself at:  EKG: [Normal sinus rhythm] at heart rate of 90, normal QRS duration, QTc 484 nonspecific ST segments and T waves? RBBno ectopy? EKG not consistent with Acute STEMI Rhythm Strip: NSR in lead II    RADIOLOGY XR chest:  unremarkable    PROCEDURES:  Critical Care performed: No  Procedures   MEDICATIONS ORDERED IN ED: Medications  ipratropium-albuterol  (DUONEB) 0.5-2.5 (3) MG/3ML nebulizer solution 3 mL (3 mLs Nebulization Given 08/07/23 1142)  sodium chloride  0.9 % bolus 1,000 mL (0 mLs Intravenous Stopped 08/07/23 1250)     IMPRESSION / MDM / ASSESSMENT AND PLAN / ED COURSE  Summary: This is a very nice 58 year old male with hypertension and GERD who presents to the emergency department with 3 weeks of intermittent chest pain with exertion   Differential Diagnosis including by not limited to: ACS, bronchospasm, GERD, anemia, heat exhaustion  ED course: Patient is well-appearing and EKG demonstrates no evidence of acute ischemia.  He does have a right bundle branch block but his troponin is not elevated x 2.  I do not think his symptoms are consistent with pulmonary embolism as he ranges between 94 to 99% on room air denies any shortness of breath currently and denies any recent prolonged immobilization or surgeries.  Patient felt much improved after obtaining the DuoNeb.  Patient given a work note from the heat for 48 hours.  He will follow-up with both his cardiologist and primary care physician.  I have refilled his albuterol  nebulizer.  He will return with any acutely worsening symptoms or any other emergency  At time of discharge there is no evidence of acute life, limb, vision, or fertility threat. Patient has stable vital signs, pain is well controlled, patient is ambulatory and p.o. tolerant.  Discharge instructions were completed using the [employer] system. I would refer you to those at this time. All warnings prescriptions follow-up etc. were discussed in detail with the patient. Patient indicates understanding and is agreeable with this plan. All questions answered.  Patient is made aware that they may return to the emergency department for any worsening or new condition or for any other  emergency.    Risk: 5 This patient has a high risk of morbidity due to further diagnostic testing or treatment. Rationale: This patient's evaluation and management involve a high risk of morbidity due to the potential severity of presenting symptoms, need for diagnostic testing, and/or initiation of treatment that may require close monitoring. The differential includes conditions with potential for significant deterioration or requiring escalation of care. Treatment decisions in the ED, including medication administration, procedural interventions, or disposition planning, reflect this level of risk. Additional Support: -- Drug therapy requiring intensive monitoring for toxicity [ ]  -- Decision regarding elective major surgery with idenitified patient or procedure risk factors [ ]  -- Decision regarding hospitalization or escalation of hospital-level care [ ]  --  Decision not to resuscitate or to de-escalate care because of poor prognosis [ ]  -- Parental controlled substances [ ]   COPA: 5 The patient has a severe exacerbation, progression, or side effect of treatment of the following illness/illnesses: []  OR  The patient has the following acute or chronic illness/injury that poses a possible threat to life or bodily function: [X] : The patient has a potentially serious acute condition or an acute exacerbation of a chronic illness requiring urgent evaluation and management in the Emergency Department. The clinical presentation necessitates immediate consideration of life-threatening or function-threatening diagnoses, even if they are ultimately ruled out.  Data(2/3 categories following were performed): 5 I reviewed or ordered at least three unique tests, external notes, and/or the history required an independent historian as one of the three requirements as following: DC summary from hospitalization in February, troponins x 2, CBC, BMP AND  I independently interpreted the following test: X-ray of  chest OR  I discussed the management of the patient with the following external physician or qualified healthcare provider: []     Suggested E/M Coding Level: 5, 99285, This has been selected based on the 2021-08-24 CPT guidelines for E/M codes in the Emergency Department based on 2/3 of the CoPA, Data, and Risk.   Clinical Course as of 08/07/23 1553  Tue Aug 07, 2023  1132 Troponin I (High Sensitivity): 4 [HD]    Clinical Course User Index [HD] Nicholaus Rolland BRAVO, MD     FINAL CLINICAL IMPRESSION(S) / ED DIAGNOSES   Final diagnoses:  Chest pain, unspecified type  Bronchospasm  Heat exhaustion, initial encounter  Smoking history     Rx / DC Orders   ED Discharge Orders          Ordered    albuterol  (VENTOLIN  HFA) 108 (90 Base) MCG/ACT inhaler  Every 6 hours PRN,   Status:  Discontinued        08/07/23 1304    albuterol  (VENTOLIN  HFA) 108 (90 Base) MCG/ACT inhaler  Every 6 hours PRN        08/07/23 1308             Note:  This document was prepared using Dragon voice recognition software and may include unintentional dictation errors.   Nicholaus Rolland BRAVO, MD 08/07/23 [phone]

## 2023-08-07 NOTE — ED Triage Notes (Signed)
 Pt to ED from home with chest pain that radiates down his arm for the last two weeks. States it worsens with exertion. Denies SOB, dizziness.

## 2023-08-07 NOTE — Discharge Instructions (Signed)
 You were seen in the emergency department today for 2 weeks of exertional chest pain.  A workup today did not identify an emergent cause for your symptoms however this does not mean that nothing is wrong, but rather you are safe to continue the workup as an outpatient.  Please do your best to cut down on your smoking.  Continue your home medications.  I have refilled your albuterol  inhaler.  Please make an appointment spoke with your cardiologist and your primary care physician.  Return with any acutely worsening symptoms or any other emergency.  It was very nice meeting you and I wish you the best of luck.  RETURN PRECAUTIONS & AFTERCARE: (ENGLISH) RETURN PRECAUTIONS: Return immediately to the emergency department or see/call your doctor if you feel worse, weak or have changes in speech or vision, are short of breath, have fever, vomiting, pain, bleeding or dark stool, trouble urinating or any new issues. Return here or see/call your doctor if not improving as expected for your suspected condition. FOLLOW-UP CARE: Call your doctor and/or any doctors we referred you to for more advice and to make an appointment. Do this today, tomorrow or after the weekend. Some doctors only take PPO insurance so if you have HMO insurance you may want to contact your HMO or your regular doctor for referral to a specialist within your plan. Either way tell the doctor's office that it was a referral from the emergency department so you get the soonest possible appointment.  YOUR TEST RESULTS: Take result reports of any blood or urine tests, imaging tests and EKG's to your doctor and any referral doctor. Have any abnormal tests repeated. Your doctor or a referral doctor can let you know when this should be done. Also make sure your doctor contacts this hospital to get any test results that are not currently available such as cultures or special tests for infection and final imaging reports, which are often not available at the  time you leave the ER but which may list additional important findings that are not documented on the preliminary report. BLOOD PRESSURE: If your blood pressure was greater than 120/80 have your blood pressure rechecked within 1 to 2 weeks. MEDICATION SIDE EFFECTS: Do not drive, walk, bike, take the bus, etc. if you have received or are being prescribed any sedating medications such as those for pain or anxiety or certain antihistamines like Benadryl. If you have been give one of these here get a taxi home or have a friend drive you home. Ask your pharmacist to counsel you on potential side effects of any new medication

## 2023-08-07 NOTE — ED Notes (Signed)
 EDP at bedside

## 2024-01-07 ENCOUNTER — Emergency Department: Payer: Self-pay

## 2024-01-07 ENCOUNTER — Emergency Department: Admission: EM | Admit: 2024-01-07 | Discharge: 2024-01-08 | Disposition: A | Payer: Self-pay

## 2024-01-07 ENCOUNTER — Other Ambulatory Visit: Payer: Self-pay

## 2024-01-07 DIAGNOSIS — L0291 Cutaneous abscess, unspecified: Secondary | ICD-10-CM

## 2024-01-07 LAB — CBC WITH DIFFERENTIAL/PLATELET
Abs Immature Granulocytes: 0.04 K/uL (ref 0.00–0.07)
Basophils Absolute: 0.1 K/uL (ref 0.0–0.1)
Basophils Relative: 0 %
Eosinophils Absolute: 0 K/uL (ref 0.0–0.5)
Eosinophils Relative: 0 %
HCT: 45.7 % (ref 39.0–52.0)
Hemoglobin: 15.3 g/dL (ref 13.0–17.0)
Immature Granulocytes: 0 %
Lymphocytes Relative: 14 %
Lymphs Abs: 2 K/uL (ref 0.7–4.0)
MCH: 28.1 pg (ref 26.0–34.0)
MCHC: 33.5 g/dL (ref 30.0–36.0)
MCV: 84 fL (ref 80.0–100.0)
Monocytes Absolute: 0.8 K/uL (ref 0.1–1.0)
Monocytes Relative: 5 %
Neutro Abs: 11.7 K/uL — ABNORMAL HIGH (ref 1.7–7.7)
Neutrophils Relative %: 81 %
Platelets: 305 K/uL (ref 150–400)
RBC: 5.44 MIL/uL (ref 4.22–5.81)
RDW: 12.7 % (ref 11.5–15.5)
WBC: 14.5 K/uL — ABNORMAL HIGH (ref 4.0–10.5)
nRBC: 0 % (ref 0.0–0.2)

## 2024-01-07 LAB — BASIC METABOLIC PANEL WITH GFR
Anion gap: 13 (ref 5–15)
BUN: 7 mg/dL (ref 6–20)
CO2: 24 mmol/L (ref 22–32)
Calcium: 9.5 mg/dL (ref 8.9–10.3)
Chloride: 101 mmol/L (ref 98–111)
Creatinine, Ser: 0.78 mg/dL (ref 0.61–1.24)
GFR, Estimated: >60 mL/min (ref >60)
Glucose, Bld: 113 mg/dL — ABNORMAL HIGH (ref 70–99)
Potassium: 3.8 mmol/L (ref 3.5–5.1)
Sodium: 138 mmol/L (ref 135–145)

## 2024-01-07 LAB — LACTIC ACID, PLASMA: Lactic Acid, Venous: 0.8 mmol/L (ref 0.5–1.9)

## 2024-01-07 MED ORDER — IOHEXOL 300 MG/ML  SOLN
75.0000 mL | Freq: Once | INTRAMUSCULAR | Status: AC | PRN
  Administered 2024-01-07: 75 mL via INTRAVENOUS

## 2024-01-07 MED ORDER — SODIUM CHLORIDE 0.9 % IV SOLN
3.0000 g | Freq: Once | INTRAVENOUS | Status: AC
  Administered 2024-01-07: 3 g via INTRAVENOUS
  Filled 2024-01-07: qty 8

## 2024-01-07 MED ORDER — MORPHINE SULFATE (PF) 4 MG/ML IV SOLN
4.0000 mg | Freq: Once | INTRAVENOUS | Status: AC
  Administered 2024-01-07: 23:00:00 4 mg via INTRAVENOUS
  Filled 2024-01-07: qty 1

## 2024-01-07 NOTE — ED Triage Notes (Addendum)
 Pt reports he has some swelling to the roof of his mouth and he can feel it going down in throat and into his nose. Pt reports it has been getting worse since Friday, pt saw a dentist but they recommended he see an oral surgeon. Pt was not started on abx but was referred to come to ER for that and possible drainage of abscess. Pt reports hx HTN, has been out of lisinopril  of the past 4 days.

## 2024-01-07 NOTE — ED Provider Notes (Signed)
 [facility] Provider Note    Event Date/Time   First MD Initiated Contact with Patient 01/07/24 2208     (approximate)   History   Abscess   HPI  Gerald George is a 57 y.o. male presenting to the emergency department complaining of an abscess to the roof of his mouth which has been worsening for the last 3 days.  The patient reports that it feels as if it is moving down his throat and into his nose.  He reports that he can also feel it in his ear.  He states that he saw a dentist but that they were concerned that it may be in his sinuses and so told him to go to the ER because he may need to see an oral careers adviser.  He was not started on any antibiotics.     Physical Exam   Triage Vital Signs: ED Triage Vitals  Encounter Vitals Group     BP 01/07/24 2018 (!) 218/103     Girls Systolic BP Percentile --      Girls Diastolic BP Percentile --      Boys Systolic BP Percentile --      Boys Diastolic BP Percentile --      Pulse Rate 01/07/24 2018 81     Resp 01/07/24 2018 20     Temp 01/07/24 2018 97.8 F (36.6 C)     Temp src --      SpO2 01/07/24 2018 98 %     Weight 01/07/24 2018 193 lb (87.5 kg)     Height 01/07/24 2018 5' 7 (1.702 m)     Head Circumference --      Peak Flow --      Pain Score 01/07/24 2017 10     Pain Loc --      Pain Education --      Exclude from Growth Chart --     Most recent vital signs: Vitals:   01/07/24 2018  BP: (!) 218/103  Pulse: 81  Resp: 20  Temp: 97.8 F (36.6 C)  SpO2: 98%     General: Awake, no distress.  CV:  Good peripheral perfusion.  Resp:  Normal effort.  Abd:  No distention.  Other:  2 cm bulging area of fluctuance to the right side of the hard palate with drainage of purulent fluid   ED Results / Procedures / Treatments   Labs (all labs ordered are listed, but only abnormal results are displayed) Labs Reviewed  BASIC METABOLIC PANEL WITH GFR  LACTIC ACID, PLASMA  LACTIC ACID, PLASMA   CBC WITH DIFFERENTIAL/PLATELET     EKG     RADIOLOGY     PROCEDURES:  Critical Care performed: No  Procedures   MEDICATIONS ORDERED IN ED: Medications  morphine  (PF) 4 MG/ML injection 4 mg (4 mg Intravenous Given 01/07/24 2244)     IMPRESSION / MDM / ASSESSMENT AND PLAN / ED COURSE  I reviewed the triage vital signs and the nursing notes.                               Patient's presentation is most consistent with acute presentation with potential threat to life or bodily function.  Patient is a 58 year old male presenting to the emergency department complaining of an abscess to his hard palate with concerns for invasion into his sinus.  Given the patient's complaints lab work and a CT  max face ordered for further evaluation.  Patient's care was signed out pending lab results and scan results.  If CT scan is unremarkable the patient will be discharged with antibiotics.      FINAL CLINICAL IMPRESSION(S) / ED DIAGNOSES   Final diagnoses:  Abscess     Rx / DC Orders   ED Discharge Orders     None        Note:  This document was prepared using Dragon voice recognition software and may include unintentional dictation errors.   Rexford Reche HERO, MD 01/07/24 2329

## 2024-01-08 MED ORDER — OXYCODONE HCL 5 MG PO TABS: 5.0000 mg | ORAL_TABLET | Freq: Three times a day (TID) | ORAL | 0 refills | Status: AC | PRN

## 2024-01-08 NOTE — ED Provider Notes (Signed)
 Odontogenic likely source of abscess, measuring approximately 1.5 x 1.5 cm along the right midline of soft palate with no suggestion of deeper tracking, CT imaging corresponds to the size visually I can see on the roof of his mouth.  No evidence of airway obstruction.  Already draining.  Prescription for antibiotics already written by his dentist.  Will give IV dose now.  He has a referral to oral surgeon who he will call first thing in the morning.  Appropriate for discharge.   Cyrena Mylar, MD 01/08/24 [phone]

## 2024-01-08 NOTE — Discharge Instructions (Addendum)
 It seems that the infection is isolated and small it does not appear to track deeper into the sinuses.  It appears that it is coming from a tooth infection.   Take acetaminophen  650 mg and ibuprofen 400 mg every 6 hours for pain.  Take with food. Use oxycodone  for severe pain only, use as prescribed.  Take antibiotics as previously prescribed by your dentist.  Call the oral surgeon that they recommended first thing in the morning.  See an oral surgeon as soon as possible for their evaluation and treatment planning.  Thank you for choosing us  for your health care today!  Please see your primary doctor this week for a follow up appointment.   If you have any new, worsening, or unexpected symptoms call your doctor right away or come back to the emergency department for reevaluation.
